# Patient Record
Sex: Male | Born: 1960 | Hispanic: Yes | Marital: Single | Smoking: Never smoker
Health system: Southern US, Community
[De-identification: ages and names within clinical notes are randomized; demographics above are authoritative.]

## PROBLEM LIST (undated history)

## (undated) DIAGNOSIS — M1990 Unspecified osteoarthritis, unspecified site: Secondary | ICD-10-CM

## (undated) DIAGNOSIS — G939 Disorder of brain, unspecified: Secondary | ICD-10-CM

## (undated) DIAGNOSIS — S069X9S Unspecified intracranial injury with loss of consciousness of unspecified duration, sequela: Secondary | ICD-10-CM

## (undated) DIAGNOSIS — F419 Anxiety disorder, unspecified: Secondary | ICD-10-CM

## (undated) DIAGNOSIS — F32A Depression, unspecified: Secondary | ICD-10-CM

## (undated) DIAGNOSIS — K5792 Diverticulitis of intestine, part unspecified, without perforation or abscess without bleeding: Secondary | ICD-10-CM

## (undated) DIAGNOSIS — F329 Major depressive disorder, single episode, unspecified: Secondary | ICD-10-CM

## (undated) HISTORY — DX: Unspecified intracranial injury with loss of consciousness of unspecified duration, sequela: S06.9X9S

## (undated) HISTORY — DX: Major depressive disorder, single episode, unspecified: F32.9

## (undated) HISTORY — DX: Disorder of brain, unspecified: G93.9

## (undated) HISTORY — DX: Depression, unspecified: F32.A

## (undated) HISTORY — DX: Anxiety disorder, unspecified: F41.9

## (undated) HISTORY — PX: NO PAST SURGERIES: SHX2092

## (undated) HISTORY — DX: Diverticulitis of intestine, part unspecified, without perforation or abscess without bleeding: K57.92

## (undated) HISTORY — DX: Unspecified osteoarthritis, unspecified site: M19.90

---

## 2016-09-02 ENCOUNTER — Emergency Department
Admission: EM | Admit: 2016-09-02 | Discharge: 2016-09-02 | Disposition: A | Discharge status: Home / Self Care | Payer: Medicare HMO | Attending: Emergency Medicine | Admitting: Emergency Medicine

## 2016-09-02 ENCOUNTER — Emergency Department: Payer: Medicare HMO

## 2016-09-02 DIAGNOSIS — J989 Respiratory disorder, unspecified: Secondary | ICD-10-CM | POA: Insufficient documentation

## 2016-09-02 DIAGNOSIS — J40 Bronchitis, not specified as acute or chronic: Secondary | ICD-10-CM

## 2016-09-02 DIAGNOSIS — J988 Other specified respiratory disorders: Secondary | ICD-10-CM

## 2016-09-02 DIAGNOSIS — R05 Cough: Secondary | ICD-10-CM | POA: Diagnosis present

## 2016-09-02 DIAGNOSIS — B9789 Other viral agents as the cause of diseases classified elsewhere: Secondary | ICD-10-CM

## 2016-09-02 LAB — POCT RAPID STREP A: STREPTOCOCCUS, GROUP A SCREEN (DIRECT): NEGATIVE

## 2016-09-02 MED ORDER — CETIRIZINE HCL 10 MG PO TABS: 10.0000 mg | ORAL_TABLET | Freq: Every day | ORAL | 0 refills | Status: AC

## 2016-09-02 MED ORDER — PREDNISONE 50 MG PO TABS: 50.0000 mg | ORAL_TABLET | Freq: Every day | ORAL | 0 refills | Status: DC

## 2016-09-02 MED ORDER — ALBUTEROL SULFATE HFA 108 (90 BASE) MCG/ACT IN AERS: 2.0000 | INHALATION_SPRAY | RESPIRATORY_TRACT | 0 refills | Status: AC | PRN

## 2016-09-02 MED ORDER — CYCLOBENZAPRINE HCL 10 MG PO TABS: 10.0000 mg | ORAL_TABLET | Freq: Three times a day (TID) | ORAL | 0 refills | Status: DC | PRN

## 2016-09-02 MED ORDER — PSEUDOEPH-BROMPHEN-DM 30-2-10 MG/5ML PO SYRP: 10.0000 mL | ORAL_SOLUTION | Freq: Four times a day (QID) | ORAL | 0 refills | Status: DC | PRN

## 2016-09-02 MED ORDER — FLUTICASONE PROPIONATE 50 MCG/ACT NA SUSP: 1.0000 | Freq: Two times a day (BID) | NASAL | 0 refills | Status: DC

## 2016-09-02 NOTE — ED Triage Notes (Signed)
Pt in with co cough and congestion since yesterday. Pt states chest soreness only when he coughs or takes a deep breath. Pt also sore throat and body aches.

## 2016-09-02 NOTE — ED Provider Notes (Signed)
[facility] Emergency Department Provider Note  ____________________________________________  Time seen: Approximately 8:31 PM  I have reviewed the triage vital signs and the nursing notes.   HISTORY  Chief Complaint Cough    HPI Eric Lloyd is a 55 y.o. male who presents emergency department complaining ofnasal congestion, sore throat, cough, fevers and chills, myalgias. Patient states that symptoms have been ongoing 2 days. He has not tried any medications over-the-counter. Patient reports that he does have a burning sensation in his chest when he coughs. Patient denies any headache, visual changes, neck pain, ongoing chest pain, shortness of breath, abdominal pain, nausea or vomiting. Patient does have sick contacts within the family   No past medical history on file.  There are no active problems to display for this patient.   No past surgical history on file.  Prior to Admission medications   Medication Sig Start Date End Date Taking? Authorizing Provider  albuterol (PROVENTIL HFA;VENTOLIN HFA) 108 (90 Base) MCG/ACT inhaler Inhale 2 puffs into the lungs every 4 (four) hours as needed for wheezing or shortness of breath. 09/02/16   Charline Bills Cuthriell, PA-C  brompheniramine-pseudoephedrine-DM 30-2-10 MG/5ML syrup Take 10 mLs by mouth 4 (four) times daily as needed. 09/02/16   Charline Bills Cuthriell, PA-C  cetirizine (ZYRTEC) 10 MG tablet Take 1 tablet (10 mg total) by mouth daily. 09/02/16   Charline Bills Cuthriell, PA-C  cyclobenzaprine (FLEXERIL) 10 MG tablet Take 1 tablet (10 mg total) by mouth 3 (three) times daily as needed for muscle spasms. 09/02/16   Charline Bills Cuthriell, PA-C  fluticasone (FLONASE) 50 MCG/ACT nasal spray Place 1 spray into both nostrils 2 (two) times daily. 09/02/16   Charline Bills Cuthriell, PA-C  predniSONE (DELTASONE) 50 MG tablet Take 1 tablet (50 mg total) by mouth daily with breakfast. 09/02/16   Charline Bills Cuthriell, PA-C     Allergies Patient has no known allergies.  No family history on file.  Social History Social History  Substance Use Topics  . Smoking status: Not on file  . Smokeless tobacco: Not on file  . Alcohol use Not on file     Review of Systems  Constitutional: No fever/chills Eyes: No visual changes. ENT: Positive for nasal congestion and sore throat Cardiovascular: no chest pain. Respiratory: Positive cough. No SOB. Gastrointestinal: No abdominal pain.  No nausea, no vomiting.  No diarrhea.  No constipation. Musculoskeletal: Negative for musculoskeletal pain. Skin: Negative for rash, abrasions, lacerations, ecchymosis. Neurological: Negative for headaches, focal weakness or numbness. 10-point ROS otherwise negative.  ____________________________________________   PHYSICAL EXAM:  VITAL SIGNS: ED Triage Vitals  Enc Vitals Group     BP 09/02/16 1925 113/86     Pulse Rate 09/02/16 1925 90     Resp 09/02/16 1925 20     Temp 09/02/16 1925 98.4 F (36.9 C)     Temp Source 09/02/16 1925 Oral     SpO2 09/02/16 1925 96 %     Weight 09/02/16 1925 185 lb (83.9 kg)     Height 09/02/16 1925 5\' 11"  (1.803 m)     Head Circumference --      Peak Flow --      Pain Score 09/02/16 2016 7     Pain Loc --      Pain Edu? --      Excl. in [place]? --      Constitutional: Alert and oriented. Well appearing and in no acute distress. Eyes: Conjunctivae are normal. PERRL. EOMI. Head:  Atraumatic. ENT:      Ears: EACs unremarkable bilaterally. TMs unremarkable bilaterally.      Nose: Moderate purulent congestion/rhinnorhea.      Mouth/Throat: Mucous membranes are moist. Oropharynx is moderately erythematous but not edematous. Uvula is midline. Tonsils are erythematous but not edematous and no exudates present. Neck: No stridor. Neck is supple with full range of motion Hematological/Lymphatic/Immunilogical: No cervical lymphadenopathy. Cardiovascular: Normal rate, regular rhythm. Normal S1  and S2.  Good peripheral circulation. Respiratory: Normal respiratory effort without tachypnea or retractions. Lungs with a few common minor expiratory scattered wheezes. Good air entry to the bases with no decreased or absent breath sounds. *Gastrointestinal: Bowel sounds 4 quadrants. Soft and nontender to palpation. No guarding or rigidity. No palpable masses. No distention.  Musculoskeletal: Full range of motion to all extremities. No gross deformities appreciated. Neurologic:  Normal speech and language. No gross focal neurologic deficits are appreciated.  Skin:  Skin is warm, dry and intact. No rash noted. Psychiatric: Mood and affect are normal. Speech and behavior are normal. Patient exhibits appropriate insight and judgement.   ____________________________________________   LABS (all labs ordered are listed, but only abnormal results are displayed)  Labs Reviewed  POCT RAPID STREP A   ____________________________________________  EKG  EKG reveals normal sinus rhythm at a rate of 82 bpm. No ST elevation or depression noted. Nonspecific T-wave changes in V2 through V4. PR, QRS, QT intervals within normal limits. No Q waves or delta waves identified. No STEMI. Normal EKG. ____________________________________________  RADIOLOGY Diamantina Providence Cuthriell, personally viewed and evaluated these images (plain radiographs) as part of my medical decision making, as well as reviewing the written report by the radiologist.  Dg Chest 2 View  Result Date: 09/02/2016 CLINICAL DATA:  55 year old male with a history of cough and congestion EXAM: CHEST  2 VIEW COMPARISON:  None. FINDINGS: The heart size and mediastinal contours are within normal limits. Both lungs are clear. The visualized skeletal structures are unremarkable. IMPRESSION: No radiographic evidence of acute cardiopulmonary disease. Signed, Dulcy Fanny. Earleen Newport, DO [facility]  Electronically Signed   By: Corrie Mckusick D.O.   On: 09/02/2016 19:52    ____________________________________________    PROCEDURES  Procedure(s) performed:    Procedures    Medications - No data to display   ____________________________________________   INITIAL IMPRESSION / ASSESSMENT AND PLAN / ED COURSE  Pertinent labs & imaging results that were available during my care of the patient were reviewed by me and considered in my medical decision making (see chart for details).  Review of the [place] CSRS was performed in accordance of the [place] prior to dispensing any controlled drugs.  Clinical Course     Patient's diagnosis is consistent with Probable upper respiratory illness with bronchitis. Patient's exam is reassuring. X-ray reveals no acute consolidation consistent with pneumonia and no other cardiopulmonary abnormality Patient will be discharged home with prescriptions for symptom control medications. Patient is to follow up with primary care as needed or otherwise directed. Patient is given ED precautions to return to the ED for any worsening or new symptoms.     ____________________________________________  FINAL CLINICAL IMPRESSION(S) / ED DIAGNOSES  Final diagnoses:  Viral respiratory illness  Bronchitis      NEW MEDICATIONS STARTED DURING THIS VISIT:  Discharge Medication List as of 09/02/2016  8:47 PM    START taking these medications   Details  albuterol (PROVENTIL HFA;VENTOLIN HFA) 108 (90 Base) MCG/ACT inhaler  Inhale 2 puffs into the lungs every 4 (four) hours as needed for wheezing or shortness of breath., Starting Tue 09/02/2016, Print    brompheniramine-pseudoephedrine-DM 30-2-10 MG/5ML syrup Take 10 mLs by mouth 4 (four) times daily as needed., Starting Tue 09/02/2016, Print    cetirizine (ZYRTEC) 10 MG tablet Take 1 tablet (10 mg total) by mouth daily., Starting Tue 09/02/2016, Print    cyclobenzaprine (FLEXERIL) 10 MG tablet Take 1 tablet  (10 mg total) by mouth 3 (three) times daily as needed for muscle spasms., Starting Tue 09/02/2016, Print    fluticasone (FLONASE) 50 MCG/ACT nasal spray Place 1 spray into both nostrils 2 (two) times daily., Starting Tue 09/02/2016, Print    predniSONE (DELTASONE) 50 MG tablet Take 1 tablet (50 mg total) by mouth daily with breakfast., Starting Tue 09/02/2016, Print            This chart was dictated using voice recognition software/Dragon. Despite best efforts to proofread, errors can occur which can change the meaning. Any change was purely unintentional.    Darletta Moll, PA-C 09/03/16 0008    Nance Pear, MD 09/06/16 1501

## 2016-09-25 ENCOUNTER — Ambulatory Visit: Payer: Medicaid Other | Admitting: Family Medicine

## 2016-09-27 ENCOUNTER — Encounter: Payer: Self-pay | Admitting: Urgent Care

## 2016-09-27 DIAGNOSIS — R51 Headache: Secondary | ICD-10-CM | POA: Insufficient documentation

## 2016-09-27 DIAGNOSIS — R109 Unspecified abdominal pain: Secondary | ICD-10-CM | POA: Diagnosis present

## 2016-09-27 DIAGNOSIS — Z79899 Other long term (current) drug therapy: Secondary | ICD-10-CM | POA: Insufficient documentation

## 2016-09-27 DIAGNOSIS — R1084 Generalized abdominal pain: Secondary | ICD-10-CM | POA: Insufficient documentation

## 2016-09-27 DIAGNOSIS — R1033 Periumbilical pain: Secondary | ICD-10-CM | POA: Diagnosis not present

## 2016-09-27 NOTE — ED Triage Notes (Signed)
Patient with complaint of generalized abdominal pain times 15- 20 minutes. Patient denies nausea, vomiting or diarrhea. Patient reports that he has had similar symptoms in the past but has never been seen for them.

## 2016-09-27 NOTE — ED Triage Notes (Signed)
Patient presents with c/o periumbilical pain that started approximately 15 minutes PTA. Patient reports that this pain is recurrent; has been intermittent for over a year. (+) constipation reported. Patient also has a headache.

## 2016-09-28 ENCOUNTER — Emergency Department: Payer: Medicare HMO

## 2016-09-28 ENCOUNTER — Emergency Department
Admission: EM | Admit: 2016-09-28 | Discharge: 2016-09-28 | Disposition: A | Discharge status: Home / Self Care | Payer: Medicare HMO | Attending: Emergency Medicine | Admitting: Emergency Medicine

## 2016-09-28 DIAGNOSIS — R1033 Periumbilical pain: Secondary | ICD-10-CM | POA: Diagnosis not present

## 2016-09-28 DIAGNOSIS — R1084 Generalized abdominal pain: Secondary | ICD-10-CM | POA: Diagnosis not present

## 2016-09-28 LAB — CBC
HCT: 47.6 % (ref 40.0–52.0)
Hemoglobin: 16.3 g/dL (ref 13.0–18.0)
MCH: 29.5 pg (ref 26.0–34.0)
MCHC: 34.3 g/dL (ref 32.0–36.0)
MCV: 86.1 fL (ref 80.0–100.0)
PLATELETS: 264 10*3/uL (ref 150–440)
RBC: 5.53 MIL/uL (ref 4.40–5.90)
RDW: 13.9 % (ref 11.5–14.5)
WBC: 9.4 10*3/uL (ref 3.8–10.6)

## 2016-09-28 LAB — COMPREHENSIVE METABOLIC PANEL
ALK PHOS: 49 U/L (ref 38–126)
ALT: 21 U/L (ref 17–63)
AST: 23 U/L (ref 15–41)
Albumin: 4.2 g/dL (ref 3.5–5.0)
Anion gap: 7 (ref 5–15)
BUN: 14 mg/dL (ref 6–20)
CALCIUM: 9.5 mg/dL (ref 8.9–10.3)
CO2: 26 mmol/L (ref 22–32)
CREATININE: 1.16 mg/dL (ref 0.61–1.24)
Chloride: 104 mmol/L (ref 101–111)
GFR calc non Af Amer: >60 mL/min (ref >60)
Glucose, Bld: 98 mg/dL (ref 65–99)
Potassium: 4.2 mmol/L (ref 3.5–5.1)
SODIUM: 137 mmol/L (ref 135–145)
Total Bilirubin: 0.8 mg/dL (ref 0.3–1.2)
Total Protein: 7.7 g/dL (ref 6.5–8.1)

## 2016-09-28 LAB — URINALYSIS, COMPLETE (UACMP) WITH MICROSCOPIC
Bacteria, UA: NONE SEEN
Bilirubin Urine: NEGATIVE
GLUCOSE, UA: NEGATIVE mg/dL
Ketones, ur: NEGATIVE mg/dL
Leukocytes, UA: NEGATIVE
Nitrite: NEGATIVE
PH: 7 (ref 5.0–8.0)
Protein, ur: NEGATIVE mg/dL
SPECIFIC GRAVITY, URINE: 1.012 (ref 1.005–1.030)

## 2016-09-28 LAB — LIPASE, BLOOD: Lipase: 24 U/L (ref 11–51)

## 2016-09-28 MED ORDER — BUTALBITAL-APAP-CAFFEINE 50-325-40 MG PO TABS
1.0000 | ORAL_TABLET | Freq: Once | ORAL | Status: AC
  Administered 2016-09-28: 1 via ORAL

## 2016-09-28 MED ORDER — POLYETHYLENE GLYCOL 3350 17 G PO PACK: 17.0000 g | PACK | Freq: Every day | ORAL | 0 refills | Status: AC

## 2016-09-28 MED ORDER — GI COCKTAIL ~~LOC~~
30.0000 mL | Freq: Once | ORAL | Status: AC
  Administered 2016-09-28: 30 mL via ORAL
  Filled 2016-09-28: qty 30

## 2016-09-28 MED ORDER — BUTALBITAL-APAP-CAFFEINE 50-325-40 MG PO TABS
ORAL_TABLET | ORAL | Status: AC
  Filled 2016-09-28: qty 1

## 2016-09-28 MED ORDER — DOCUSATE SODIUM 100 MG PO CAPS: 100.0000 mg | ORAL_CAPSULE | Freq: Every day | ORAL | 0 refills | Status: AC

## 2016-09-28 MED ORDER — SUCRALFATE 1 G PO TABS: 1.0000 g | ORAL_TABLET | Freq: Two times a day (BID) | ORAL | 0 refills | Status: DC

## 2016-09-28 NOTE — ED Notes (Signed)
In at [facility] request to collect blood and urine specimen

## 2016-09-28 NOTE — ED Provider Notes (Signed)
[facility] Emergency Department Provider Note   ____________________________________________   First MD Initiated Contact with Patient 09/28/16 [phone]     (approximate)  I have reviewed the triage vital signs and the nursing notes.   HISTORY  Chief Complaint Abdominal Pain    HPI Eric Lloyd is a 56 y.o. male who comes into the hospital today with abdominal pain. According to his significant other he's had this pain for a long time but today was stronger than before. He reports that it still there and rates the pain a 9 out of 10 in intensity. The patient reports that the pain is in his mid abdomen across his abdomen. He did not take anything for pain. He also has been having some daily headaches and has taken Tylenol but it has not helped. He reports that this is also been going on for more than a year. He just got insurance and has a primary care physician appointment on Wednesday. According to the patient's significant other he had had a traumatic brain injury and had been on medications for headaches in the past. The patient has not had a nausea or vomiting but has had a problem with constipation. She reports that he is also had diverticulitis and was on medication for that as well but not anymore. The patient's wife reports that he had a small amount of a bowel movement today and whenever he uses the bathroom in small balls. The patient has not had any fevers or any problems urinating. He does not take anything for his constipation. He has no chest pain or shortness of breath. He was taking Abilify and Depakote previously he states for headache and psych issues but he finished them in December and has not had any refills. The patient is here today for evaluation.   History reviewed. No pertinent past medical history.  There are no active problems to display for this patient.   History reviewed. No pertinent surgical history.  Prior to Admission medications    Medication Sig Start Date End Date Taking? Authorizing Provider  albuterol (PROVENTIL HFA;VENTOLIN HFA) 108 (90 Base) MCG/ACT inhaler Inhale 2 puffs into the lungs every 4 (four) hours as needed for wheezing or shortness of breath. 09/02/16   Charline Bills Cuthriell, PA-C  brompheniramine-pseudoephedrine-DM 30-2-10 MG/5ML syrup Take 10 mLs by mouth 4 (four) times daily as needed. 09/02/16   Charline Bills Cuthriell, PA-C  cetirizine (ZYRTEC) 10 MG tablet Take 1 tablet (10 mg total) by mouth daily. 09/02/16   Charline Bills Cuthriell, PA-C  cyclobenzaprine (FLEXERIL) 10 MG tablet Take 1 tablet (10 mg total) by mouth 3 (three) times daily as needed for muscle spasms. 09/02/16   Charline Bills Cuthriell, PA-C  docusate sodium (COLACE) 100 MG capsule Take 1 capsule (100 mg total) by mouth daily. 09/28/16 09/28/17  Loney Hering, MD  fluticasone (FLONASE) 50 MCG/ACT nasal spray Place 1 spray into both nostrils 2 (two) times daily. 09/02/16   Charline Bills Cuthriell, PA-C  polyethylene glycol (MIRALAX) packet Take 17 g by mouth daily. 09/28/16   Loney Hering, MD  predniSONE (DELTASONE) 50 MG tablet Take 1 tablet (50 mg total) by mouth daily with breakfast. 09/02/16   Charline Bills Cuthriell, PA-C  sucralfate (CARAFATE) 1 g tablet Take 1 tablet (1 g total) by mouth 2 (two) times daily. 09/28/16   Loney Hering, MD    Allergies Patient has no known allergies.  No family history on file.  Social History Social History  Substance Use Topics  . Smoking status: Never Smoker  . Smokeless tobacco: Never Used  . Alcohol use No    Review of Systems Constitutional: No fever/chills Eyes: No visual changes. ENT: No sore throat. Cardiovascular: Denies chest pain. Respiratory: Denies shortness of breath. Gastrointestinal: abdominal pain and constipation with No nausea, no vomiting, No diarrhea.   Genitourinary: Negative for dysuria. Musculoskeletal: Negative for back pain. Skin: Negative for rash. Neurological:  Negative for headaches, focal weakness or numbness.  10-point ROS otherwise negative.  ____________________________________________   PHYSICAL EXAM:  VITAL SIGNS: ED Triage Vitals [09/27/16 2213]  Enc Vitals Group     BP 129/87     Pulse Rate 67     Resp 16     Temp 97.8 F (36.6 C)     Temp Source Oral     SpO2 98 %     Weight 185 lb (83.9 kg)     Height 5\' 11"  (1.803 m)     Head Circumference      Peak Flow      Pain Score 10     Pain Loc      Pain Edu?      Excl. in [place]?    Constitutional: Alert and oriented. Well appearing and in mild distress. Eyes: Conjunctivae are normal. PERRL. EOMI. Head: Atraumatic. Nose: No congestion/rhinnorhea. Mouth/Throat: Mucous membranes are moist.  Oropharynx non-erythematous. Cardiovascular: Normal rate, regular rhythm. Grossly normal heart sounds.  Good peripheral circulation. Respiratory: Normal respiratory effort.  No retractions. Lungs CTAB. Gastrointestinal: Soft with no tenderness to palpation of the abdomen. No distention. Positive bowel sounds Musculoskeletal: No lower extremity tenderness nor edema.   Neurologic:  Normal speech and language.  Skin:  Skin is warm, dry and intact.  Psychiatric: Mood and affect are normal.   ____________________________________________   LABS (all labs ordered are listed, but only abnormal results are displayed)  Labs Reviewed  URINALYSIS, COMPLETE (UACMP) WITH MICROSCOPIC - Abnormal; Notable for the following:       Result Value   Color, Urine YELLOW (*)    APPearance CLEAR (*)    Hgb urine dipstick SMALL (*)    Squamous Epithelial / LPF 0-5 (*)    All other components within normal limits  LIPASE, BLOOD  COMPREHENSIVE METABOLIC PANEL  CBC   ____________________________________________  EKG  none ____________________________________________  RADIOLOGY  KUB ____________________________________________   PROCEDURES  Procedure(s) performed: None  Procedures  Critical  Care performed: No  ____________________________________________   INITIAL IMPRESSION / ASSESSMENT AND PLAN / ED COURSE  Pertinent labs & imaging results that were available during my care of the patient were reviewed by me and considered in my medical decision making (see chart for details).  This is a 56 year old male who comes into the hospital today with abdominal pain. He has had this pain on and off for over a year but reports that it was really bad today. The patient is not tender on exam and his blood work is unremarkable. I'll give the patient a GI cocktail and sent him for a KUB. The patient does have a history of constipation so it is likely that his pain may be due to his constipation. I will then reassess the patient.  Clinical Course as of Sep 29 343  Nancy Fetter Sep 28, 2016  0242 Nonobstructive bowel gas pattern. DG Abdomen 1 View [AW]    Clinical Course User Index [AW] Loney Hering, MD   After the GI cocktail the patient reports this pain  is mildly improved. The patient's significant other did ask me about linzess. I then asked if he has had abdominal problems in the past. She reports that he has. She was not familiar with IBS but if he was on linzess it is likely the patient has some IBS. I informed her that he needs to follow-up with his primary care physician for further evaluation and for this medication to be restarted. The patient will be discharged to home.  ____________________________________________   FINAL CLINICAL IMPRESSION(S) / ED DIAGNOSES  Final diagnoses:  Generalized abdominal pain      NEW MEDICATIONS STARTED DURING THIS VISIT:  New Prescriptions   DOCUSATE SODIUM (COLACE) 100 MG CAPSULE    Take 1 capsule (100 mg total) by mouth daily.   POLYETHYLENE GLYCOL (MIRALAX) PACKET    Take 17 g by mouth daily.   SUCRALFATE (CARAFATE) 1 G TABLET    Take 1 tablet (1 g total) by mouth 2 (two) times daily.     Note:  This document was prepared using  Dragon voice recognition software and may include unintentional dictation errors.    Loney Hering, MD 09/28/16 [phone]

## 2016-10-01 ENCOUNTER — Ambulatory Visit: Payer: Medicaid Other | Admitting: Family Medicine

## 2016-10-13 ENCOUNTER — Other Ambulatory Visit: Payer: Self-pay

## 2016-10-16 ENCOUNTER — Encounter: Payer: Self-pay | Admitting: Family Medicine

## 2016-10-16 ENCOUNTER — Ambulatory Visit (INDEPENDENT_AMBULATORY_CARE_PROVIDER_SITE_OTHER): Payer: Medicare HMO | Admitting: Family Medicine

## 2016-10-16 VITALS — BP 120/72 | HR 79 | Temp 98.4°F | Ht 73.0 in | Wt 184.0 lb

## 2016-10-16 DIAGNOSIS — F322 Major depressive disorder, single episode, severe without psychotic features: Secondary | ICD-10-CM | POA: Diagnosis not present

## 2016-10-16 DIAGNOSIS — F419 Anxiety disorder, unspecified: Secondary | ICD-10-CM | POA: Insufficient documentation

## 2016-10-16 DIAGNOSIS — Z113 Encounter for screening for infections with a predominantly sexual mode of transmission: Secondary | ICD-10-CM

## 2016-10-16 DIAGNOSIS — F329 Major depressive disorder, single episode, unspecified: Secondary | ICD-10-CM | POA: Insufficient documentation

## 2016-10-16 DIAGNOSIS — F332 Major depressive disorder, recurrent severe without psychotic features: Secondary | ICD-10-CM | POA: Diagnosis not present

## 2016-10-16 DIAGNOSIS — G8929 Other chronic pain: Secondary | ICD-10-CM | POA: Diagnosis not present

## 2016-10-16 DIAGNOSIS — Z13220 Encounter for screening for lipoid disorders: Secondary | ICD-10-CM | POA: Diagnosis not present

## 2016-10-16 DIAGNOSIS — Z125 Encounter for screening for malignant neoplasm of prostate: Secondary | ICD-10-CM | POA: Diagnosis not present

## 2016-10-16 DIAGNOSIS — F32A Depression, unspecified: Secondary | ICD-10-CM | POA: Insufficient documentation

## 2016-10-16 DIAGNOSIS — R1084 Generalized abdominal pain: Secondary | ICD-10-CM | POA: Diagnosis not present

## 2016-10-16 LAB — UA/M W/RFLX CULTURE, ROUTINE
BILIRUBIN UA: NEGATIVE
GLUCOSE, UA: NEGATIVE
KETONES UA: NEGATIVE
LEUKOCYTES UA: NEGATIVE
Nitrite, UA: NEGATIVE
PROTEIN UA: NEGATIVE
SPEC GRAV UA: 1.025 (ref 1.005–1.030)
Urobilinogen, Ur: 0.2 mg/dL (ref 0.2–1.0)
pH, UA: 5.5 (ref 5.0–7.5)

## 2016-10-16 LAB — MICROSCOPIC EXAMINATION

## 2016-10-16 MED ORDER — ARIPIPRAZOLE 10 MG PO TABS: 10.0000 mg | ORAL_TABLET | Freq: Every day | ORAL | 3 refills | Status: AC

## 2016-10-16 MED ORDER — CYCLOBENZAPRINE HCL 10 MG PO TABS: 10.0000 mg | ORAL_TABLET | Freq: Every day | ORAL | 0 refills | Status: DC

## 2016-10-16 MED ORDER — CYCLOBENZAPRINE HCL 10 MG PO TABS
10.0000 mg | ORAL_TABLET | Freq: Every day | ORAL | 0 refills | Status: AC
Start: 2016-10-16 — End: ?

## 2016-10-16 MED ORDER — DIVALPROEX SODIUM 500 MG PO DR TAB: 500.0000 mg | DELAYED_RELEASE_TABLET | Freq: Two times a day (BID) | ORAL | 3 refills | Status: AC

## 2016-10-16 NOTE — Progress Notes (Signed)
BP 120/72 (BP Location: Left Arm, Patient Position: Sitting, Cuff Size: Normal)   Pulse 79   Temp 98.4 F (36.9 C)   Ht 6\' 1"  (1.854 m)   Wt 184 lb (83.5 kg)   SpO2 97%   BMI 24.28 kg/m    Subjective:    Patient ID: Eric Lloyd, male    DOB: 03-29-1961, 56 y.o.   MRN: [id number]  HPI: Eric Lloyd is a 56 y.o. male  Chief Complaint  Patient presents with  . Establish Care   ABDOMINAL PAIN- [place] in [place] with diverticulitis 2 years ago, has been having problems with his stomach for about 4 months.  Duration: about 4 months Onset: gradual Severity: 6/10 Quality: pressure Location:  diffuse  Episode duration: comes and goes 30 minutes Radiation: no Frequency: intermittent Alleviating factors: nexium- just a little, linzess Aggravating factors: kneeling Status: better Treatments attempted: linzess and PPI, tums Fever: no Nausea: no Vomiting: no Weight loss: yes- 15-20lbs, over 2 years Decreased appetite: yes Diarrhea: no Constipation: yes Blood in stool: no Heartburn: yes Jaundice: no Rash: no Dysuria/urinary frequency: no Hematuria: no Recurrent NSAID use: no  Colonoscopy- about 2 years ago, found polyps and diverticulosis  DEPRESSION- has been treated by a psychiatrist since 1991- has been following with them, feels like the medicine is working. Has been off his medicine for about 4 months. Has been having issues with his family  Mood status: uncontrolled Satisfied with current treatment?: yes Symptom severity: severe  Duration of current treatment : chronic Side effects: no Medication compliance: poor compliance Psychotherapy/counseling: no  Previous psychiatric medications: depakote and abilify Depressed mood: yes Anxious mood: yes Anhedonia: yes Significant weight loss or gain: yes Insomnia: yes- hard to fall asleep and stay asleep  Fatigue: no Feelings of worthlessness or guilt: yes Impaired concentration/indecisiveness: yes Suicidal ideations:  no Hopelessness: yes Crying spells: yes Depression screen PHQ 2/9 10/16/2016  Decreased Interest 1  Down, Depressed, Hopeless 3  PHQ - 2 Score 4  Altered sleeping 3  Tired, decreased energy 0  Change in appetite 3  Feeling bad or failure about yourself  3  Trouble concentrating 3  Moving slowly or fidgety/restless 3  Suicidal thoughts 1  PHQ-9 Score 20   GAD 7 : Generalized Anxiety Score 10/16/2016  Nervous, Anxious, on Edge 3  Control/stop worrying 3  Worry too much - different things 3  Trouble relaxing 3  Restless 3  Easily annoyed or irritable 0  Afraid - awful might happen 3  Total GAD 7 Score 18    Active Ambulatory Problems    Diagnosis Date Noted  . Anxiety   . Depression   . Chronic generalized abdominal pain 10/16/2016   Resolved Ambulatory Problems    Diagnosis Date Noted  . No Resolved Ambulatory Problems   Past Medical History:  Diagnosis Date  . Anxiety   . Arthritis   . 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 Depression   . Diverticulitis    History reviewed. No pertinent surgical history. Outpatient Encounter Prescriptions as of 10/16/2016  Medication Sig  . albuterol (PROVENTIL HFA;VENTOLIN HFA) 108 (90 Base) MCG/ACT inhaler Inhale 2 puffs into the lungs every 4 (four) hours as needed for wheezing or shortness of breath.  . ARIPiprazole (ABILIFY) 10 MG tablet Take 1 tablet (10 mg total) by mouth daily.  . cetirizine (ZYRTEC) 10 MG tablet Take 1 tablet (10 mg total) by mouth daily.  . cyclobenzaprine (FLEXERIL) 10 MG tablet Take 1 tablet (10  mg total) by mouth at bedtime.  . divalproex (DEPAKOTE) 500 MG DR tablet Take 1 tablet (500 mg total) by mouth 2 (two) times daily.  Marland Kitchen docusate sodium (COLACE) 100 MG capsule Take 1 capsule (100 mg total) by mouth daily.  . fluticasone (FLONASE) 50 MCG/ACT nasal spray Place 1 spray into both nostrils 2 (two) times daily.  . polyethylene glycol (MIRALAX) packet Take 17 g by mouth daily.  . [DISCONTINUED] ARIPiprazole  (ABILIFY) 10 MG tablet Take 10 mg by mouth daily.  . [DISCONTINUED] cyclobenzaprine (FLEXERIL) 10 MG tablet Take 1 tablet (10 mg total) by mouth 3 (three) times daily as needed for muscle spasms.  . [DISCONTINUED] cyclobenzaprine (FLEXERIL) 10 MG tablet Take 1 tablet (10 mg total) by mouth at bedtime.  . [DISCONTINUED] divalproex (DEPAKOTE) 500 MG DR tablet Take 500 mg by mouth 3 (three) times daily.  . [DISCONTINUED] brompheniramine-pseudoephedrine-DM 30-2-10 MG/5ML syrup Take 10 mLs by mouth 4 (four) times daily as needed. (Patient not taking: Reported on 10/16/2016)  . [DISCONTINUED] sucralfate (CARAFATE) 1 g tablet Take 1 tablet (1 g total) by mouth 2 (two) times daily. (Patient not taking: Reported on 10/16/2016)   No facility-administered encounter medications on file as of 10/16/2016.    No Known Allergies Social History   Social History  . Marital status: Single    Spouse name: N/A  . Number of children: N/A  . Years of education: N/A   Occupational History  . Not on file.   Social History Main Topics  . Smoking status: Never Smoker  . Smokeless tobacco: Never Used  . Alcohol use No  . Drug use: No  . Sexual activity: Not on file   Other Topics Concern  . Not on file   Social History Narrative  . No narrative on file   Family History  Problem Relation Age of Onset  . Diabetes Mother   . Hypertension Mother   . Cancer Mother   . Diabetes Brother     Review of Systems  Constitutional: Negative.   Respiratory: Negative.   Cardiovascular: Negative.   Gastrointestinal: Positive for abdominal pain and constipation. Negative for abdominal distention, anal bleeding, blood in stool, diarrhea, nausea, rectal pain and vomiting.  Genitourinary: Negative.   Psychiatric/Behavioral: Negative.     Per HPI unless specifically indicated above     Objective:    BP 120/72 (BP Location: Left Arm, Patient Position: Sitting, Cuff Size: Normal)   Pulse 79   Temp 98.4 F (36.9 C)    Ht 6\' 1"  (1.854 m)   Wt 184 lb (83.5 kg)   SpO2 97%   BMI 24.28 kg/m   Wt Readings from Last 3 Encounters:  10/16/16 184 lb (83.5 kg)  09/27/16 185 lb (83.9 kg)  09/02/16 185 lb (83.9 kg)    Physical Exam  Constitutional: He is oriented to person, place, and time. He appears well-developed and well-nourished. No distress.  HENT:  Head: Normocephalic and atraumatic.  Right Ear: Hearing normal.  Left Ear: Hearing normal.  Nose: Nose normal.  Eyes: Conjunctivae and lids are normal. Right eye exhibits no discharge. Left eye exhibits no discharge. No scleral icterus.  Pulmonary/Chest: Effort normal. No respiratory distress.  Musculoskeletal: Normal range of motion.  Neurological: He is alert and oriented to person, place, and time.  Skin: Skin is intact. No rash noted.  Psychiatric: He has a normal mood and affect. His speech is normal and behavior is normal. Judgment and thought content normal. Cognition and memory  are normal.    Results for orders placed or performed during the hospital encounter of 09/28/16  Lipase, blood  Result Value Ref Range   Lipase 24 11 - 51 U/L  Comprehensive metabolic panel  Result Value Ref Range   Sodium 137 135 - 145 mmol/L   Potassium 4.2 3.5 - 5.1 mmol/L   Chloride 104 101 - 111 mmol/L   CO2 26 22 - 32 mmol/L   Glucose, Bld 98 65 - 99 mg/dL   BUN 14 6 - 20 mg/dL   Creatinine, Ser 1.16 0.61 - 1.24 mg/dL   Calcium 9.5 8.9 - 10.3 mg/dL   Total Protein 7.7 6.5 - 8.1 g/dL   Albumin 4.2 3.5 - 5.0 g/dL   AST 23 15 - 41 U/L   ALT 21 17 - 63 U/L   Alkaline Phosphatase 49 38 - 126 U/L   Total Bilirubin 0.8 0.3 - 1.2 mg/dL   GFR calc non Af Amer >60 >60 mL/min   GFR calc Af Amer >60 >60 mL/min   Anion gap 7 5 - 15  CBC  Result Value Ref Range   WBC 9.4 3.8 - 10.6 K/uL   RBC 5.53 4.40 - 5.90 MIL/uL   Hemoglobin 16.3 13.0 - 18.0 g/dL   HCT 47.6 40.0 - 52.0 %   MCV 86.1 80.0 - 100.0 fL   MCH 29.5 26.0 - 34.0 pg   MCHC 34.3 32.0 - 36.0 g/dL    RDW 13.9 11.5 - 14.5 %   Platelets 264 150 - 440 K/uL  Urinalysis, Complete w Microscopic  Result Value Ref Range   Color, Urine YELLOW (A) YELLOW   APPearance CLEAR (A) CLEAR   Specific Gravity, Urine 1.012 1.005 - 1.030   pH 7.0 5.0 - 8.0   Glucose, UA NEGATIVE NEGATIVE mg/dL   Hgb urine dipstick SMALL (A) NEGATIVE   Bilirubin Urine NEGATIVE NEGATIVE   Ketones, ur NEGATIVE NEGATIVE mg/dL   Protein, ur NEGATIVE NEGATIVE mg/dL   Nitrite NEGATIVE NEGATIVE   Leukocytes, UA NEGATIVE NEGATIVE   RBC / HPF 0-5 0 - 5 RBC/hpf   WBC, UA 0-5 0 - 5 WBC/hpf   Bacteria, UA NONE SEEN NONE SEEN   Squamous Epithelial / LPF 0-5 (A) NONE SEEN      Assessment & Plan:   Problem List Items Addressed This Visit      Other   Anxiety    Not under good control. Unclear of diagnosis. Will get him back started on his medicine and get him into psychiatry. Checking levels today.      Relevant Orders   CBC with Differential/Platelet   Comprehensive metabolic panel   TSH   Ambulatory referral to Psychiatry   Valproic Acid level   Depression    Not under good control. Unclear of diagnosis. Will get him back started on his medicine and get him into psychiatry. Checking levels today.      Chronic generalized abdominal pain    Of unclear etiology. Will get him into GI.      Relevant Orders   Ambulatory referral to Gastroenterology    Other Visit Diagnoses    Depression, major, single episode, severe ([place])    -  Primary   Relevant Orders   CBC with Differential/Platelet   Comprehensive metabolic panel   TSH   Ambulatory referral to Psychiatry   Valproic Acid level   Screening for cholesterol level       Checking labs today.   Relevant Orders  Lipid Panel w/o Chol/HDL Ratio   Routine screening for STI (sexually transmitted infection)       Labs drawn today   Relevant Orders   UA/M w/rflx Culture, Routine   Hepatitis, Acute   RPR   GC/Chlamydia Probe Amp   HSV(herpes simplex vrs) 1+2  ab-IgG   HIV antibody   Screening for prostate cancer       Labs drawn today.   Relevant Orders   PSA       Follow up plan: Return in about 4 weeks (around 11/13/2016) for Records Release Please.

## 2016-10-16 NOTE — Assessment & Plan Note (Signed)
Not under good control. Unclear of diagnosis. Will get him back started on his medicine and get him into psychiatry. Checking levels today.

## 2016-10-16 NOTE — Assessment & Plan Note (Signed)
Of unclear etiology. Will get him into GI.

## 2016-10-17 LAB — CBC WITH DIFFERENTIAL/PLATELET
BASOS ABS: 0 10*3/uL (ref 0.0–0.2)
Basos: 0 %
EOS (ABSOLUTE): 0.2 10*3/uL (ref 0.0–0.4)
EOS: 2 %
HEMOGLOBIN: 16.3 g/dL (ref 13.0–17.7)
Hematocrit: 48 % (ref 37.5–51.0)
IMMATURE GRANS (ABS): 0 10*3/uL (ref 0.0–0.1)
Immature Granulocytes: 0 %
LYMPHS: 26 %
Lymphocytes Absolute: 2.1 10*3/uL (ref 0.7–3.1)
MCH: 29.4 pg (ref 26.6–33.0)
MCHC: 34 g/dL (ref 31.5–35.7)
MCV: 87 fL (ref 79–97)
Monocytes Absolute: 0.6 10*3/uL (ref 0.1–0.9)
Monocytes: 7 %
NEUTROS ABS: 5.3 10*3/uL (ref 1.4–7.0)
Neutrophils: 65 %
Platelets: 254 10*3/uL (ref 150–379)
RBC: 5.55 x10E6/uL (ref 4.14–5.80)
RDW: 14 % (ref 12.3–15.4)
WBC: 8.2 10*3/uL (ref 3.4–10.8)

## 2016-10-17 LAB — COMPREHENSIVE METABOLIC PANEL
ALBUMIN: 4.2 g/dL (ref 3.5–5.5)
ALT: 21 IU/L (ref 0–44)
AST: 21 IU/L (ref 0–40)
Albumin/Globulin Ratio: 1.4 (ref 1.2–2.2)
Alkaline Phosphatase: 53 IU/L (ref 39–117)
BUN / CREAT RATIO: 12 (ref 9–20)
BUN: 15 mg/dL (ref 6–24)
Bilirubin Total: 0.5 mg/dL (ref 0.0–1.2)
CO2: 22 mmol/L (ref 18–29)
Calcium: 9.9 mg/dL (ref 8.7–10.2)
Chloride: 100 mmol/L (ref 96–106)
Creatinine, Ser: 1.22 mg/dL (ref 0.76–1.27)
GFR calc non Af Amer: 67 mL/min/{1.73_m2} (ref >59)
GFR, EST AFRICAN AMERICAN: 78 mL/min/{1.73_m2} (ref >59)
GLUCOSE: 88 mg/dL (ref 65–99)
Globulin, Total: 3 g/dL (ref 1.5–4.5)
Potassium: 4.7 mmol/L (ref 3.5–5.2)
Sodium: 139 mmol/L (ref 134–144)
TOTAL PROTEIN: 7.2 g/dL (ref 6.0–8.5)

## 2016-10-17 LAB — HEPATITIS PANEL, ACUTE
HEP A IGM: NEGATIVE
Hep B C IgM: NEGATIVE
Hep C Virus Ab: <0.1 s/co ratio (ref 0.0–0.9)
Hepatitis B Surface Ag: NEGATIVE

## 2016-10-17 LAB — LIPID PANEL W/O CHOL/HDL RATIO
Cholesterol, Total: 210 mg/dL — ABNORMAL HIGH (ref 100–199)
HDL: 57 mg/dL (ref >39)
LDL CALC: 83 mg/dL (ref 0–99)
Triglycerides: 348 mg/dL — ABNORMAL HIGH (ref 0–149)
VLDL CHOLESTEROL CAL: 70 mg/dL — AB (ref 5–40)

## 2016-10-17 LAB — HIV ANTIBODY (ROUTINE TESTING W REFLEX): HIV SCREEN 4TH GENERATION: NONREACTIVE

## 2016-10-17 LAB — TSH: TSH: 0.582 u[IU]/mL (ref 0.450–4.500)

## 2016-10-17 LAB — HSV(HERPES SIMPLEX VRS) I + II AB-IGG
HSV 1 Glycoprotein G Ab, IgG: 52.2 index — ABNORMAL HIGH (ref 0.00–0.90)
HSV 2 Glycoprotein G Ab, IgG: <0.91 index (ref 0.00–0.90)

## 2016-10-17 LAB — PSA: Prostate Specific Ag, Serum: 3.3 ng/mL (ref 0.0–4.0)

## 2016-10-17 LAB — VALPROIC ACID LEVEL: Valproic Acid Lvl: <4 ug/mL — ABNORMAL LOW (ref 50–100)

## 2016-10-17 LAB — RPR: RPR Ser Ql: NONREACTIVE

## 2016-10-18 LAB — GC/CHLAMYDIA PROBE AMP
Chlamydia trachomatis, NAA: NEGATIVE
NEISSERIA GONORRHOEAE BY PCR: NEGATIVE

## 2016-10-20 ENCOUNTER — Telehealth: Payer: Self-pay | Admitting: Family Medicine

## 2016-10-20 NOTE — Telephone Encounter (Signed)
Please let him know through the language line (he speaks Spanish) that all his labs came back normal except he's been exposed to the cold sore virus. Thanks!

## 2016-10-20 NOTE — Telephone Encounter (Signed)
Patient notified

## 2016-10-30 ENCOUNTER — Telehealth: Payer: Self-pay

## 2016-10-30 NOTE — Telephone Encounter (Signed)
Patient notified

## 2016-10-30 NOTE — Telephone Encounter (Signed)
Eric Lloyd- she's coming in today for an appointment at 56, I'll make sure she talks to you

## 2016-10-30 NOTE — Telephone Encounter (Signed)
Spoke with Receptionist at [facility] for patient's counseling appointment.  Patient was late to an appointment but they had him fill out the paperwork.  Explained that patient was given a card, and all his has to do is come in any day and be seen. [place] was notified of this. Tried calling patient to notify him of this. No answer. Will try again.   Tried calling his wife Myrta, number as been disconnected and is no longer in service.

## 2016-11-06 ENCOUNTER — Ambulatory Visit: Payer: Medicare HMO | Admitting: Family Medicine

## 2016-11-07 ENCOUNTER — Ambulatory Visit (INDEPENDENT_AMBULATORY_CARE_PROVIDER_SITE_OTHER): Payer: Medicare HMO | Admitting: Family Medicine

## 2016-11-07 ENCOUNTER — Encounter: Payer: Self-pay | Admitting: Family Medicine

## 2016-11-07 VITALS — BP 126/86 | HR 64 | Temp 97.9°F | Wt 191.0 lb

## 2016-11-07 DIAGNOSIS — F332 Major depressive disorder, recurrent severe without psychotic features: Secondary | ICD-10-CM | POA: Diagnosis not present

## 2016-11-07 DIAGNOSIS — W57XXXA Bitten or stung by nonvenomous insect and other nonvenomous arthropods, initial encounter: Secondary | ICD-10-CM | POA: Diagnosis not present

## 2016-11-07 DIAGNOSIS — S30861A Insect bite (nonvenomous) of abdominal wall, initial encounter: Secondary | ICD-10-CM | POA: Diagnosis not present

## 2016-11-07 DIAGNOSIS — D171 Benign lipomatous neoplasm of skin and subcutaneous tissue of trunk: Secondary | ICD-10-CM | POA: Diagnosis not present

## 2016-11-07 DIAGNOSIS — R490 Dysphonia: Secondary | ICD-10-CM | POA: Diagnosis not present

## 2016-11-07 DIAGNOSIS — D489 Neoplasm of uncertain behavior, unspecified: Secondary | ICD-10-CM | POA: Diagnosis not present

## 2016-11-07 NOTE — Assessment & Plan Note (Signed)
Continue current regimen, await Psychiatry consult next week.

## 2016-11-07 NOTE — Progress Notes (Signed)
BP 126/86   Pulse 64   Temp 97.9 F (36.6 C)   Wt 191 lb (86.6 kg)   SpO2 98%   BMI 25.20 kg/m    Subjective:    Patient ID: Eric Lloyd, male    DOB: 1960-09-27, 56 y.o.   MRN: [id number]  HPI: Eric Lloyd is a 56 y.o. male  Chief Complaint  Patient presents with  . Anxiety  . Depression  . Nodules    He was told in [place] that he had nodules in his throat.   . Abcess    on his lower back for a couple years, not getting worse. doesn't bother him.   Patient presents for follow up since restarting his depakote and latuda several weeks ago. Doing much better than he was while off the medications, but still having some lingering issues. Has psych appt feb 28th.   Also notes that he was told in [place] that he had nodules in his throat, and when he speaks for any length of time he becomes easily hoarse. Denies pain, dysphagia, or hematemesis.   Wife would like a mass on his low back looked at. Has been there for several years, possibly fluctuating in size. Never painful, not bothersome to him. No redness, drainage, tenderness in area.   Also wanting referral to dermatology for a skin check as some of his moles concern him. One in particular on his LLQ has been red and itching the past week or so and he would like it examined.   Depression screen [facility] 2/9 11/07/2016 10/16/2016  Decreased Interest 3 1  Down, Depressed, Hopeless 2 3  PHQ - 2 Score 5 4  Altered sleeping 3 3  Tired, decreased energy 2 0  Change in appetite 3 3  Feeling bad or failure about yourself  3 3  Trouble concentrating 2 3  Moving slowly or fidgety/restless 2 3  Suicidal thoughts 1 1  PHQ-9 Score 21 20   GAD 7 : Generalized Anxiety Score 11/07/2016 10/16/2016  Nervous, Anxious, on Edge 3 3  Control/stop worrying 2 3  Worry too much - different things 3 3  Trouble relaxing 2 3  Restless 3 3  Easily annoyed or irritable 0 0  Afraid - awful might happen 3 3  Total GAD 7 Score 16 18   Past Medical  History:  Diagnosis Date  . Anxiety   . Arthritis   . 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 Depression   . Diverticulitis    Social History   Social History  . Marital status: Single    Spouse name: N/A  . Number of children: N/A  . Years of education: N/A   Occupational History  . Not on file.   Social History Main Topics  . Smoking status: Never Smoker  . Smokeless tobacco: Never Used  . Alcohol use No  . Drug use: No  . Sexual activity: Not on file   Other Topics Concern  . Not on file   Social History Narrative  . No narrative on file    Relevant past medical, surgical, family and social history reviewed and updated as indicated. Interim medical history since our last visit reviewed. Allergies and medications reviewed and updated.  Review of Systems  Constitutional: Negative.   HENT: Positive for voice change.   Respiratory: Negative.   Gastrointestinal: Negative.   Genitourinary: Negative.   Musculoskeletal: Negative.   Skin:       Concerning moles Mass on  back  Neurological: Negative.   Psychiatric/Behavioral: Positive for dysphoric mood.    Per HPI unless specifically indicated above     Objective:    BP 126/86   Pulse 64   Temp 97.9 F (36.6 C)   Wt 191 lb (86.6 kg)   SpO2 98%   BMI 25.20 kg/m   Wt Readings from Last 3 Encounters:  11/07/16 191 lb (86.6 kg)  10/16/16 184 lb (83.5 kg)  09/27/16 185 lb (83.9 kg)    Physical Exam  Constitutional: He is oriented to person, place, and time. He appears well-developed and well-nourished. No distress.  HENT:  Head: Atraumatic.  Eyes: Conjunctivae are normal. Pupils are equal, round, and reactive to light.  Neck: Normal range of motion. Neck supple.  Cardiovascular: Normal rate and normal heart sounds.   Pulmonary/Chest: Effort normal and breath sounds normal. No respiratory distress.  Musculoskeletal: Normal range of motion.  Neurological: He is alert and oriented to person, place, and time.    Skin: Skin is warm and dry.  Live tick present and embedded in LLQ of abdomen with some surrounding erythema  Lipomatous mass midline lumbar region of back  Psychiatric: He has a normal mood and affect. His behavior is normal.  Nursing note and vitals reviewed.     Assessment & Plan:   Problem List Items Addressed This Visit      Other   Depression - Primary    Continue current regimen, await Psychiatry consult next week.        Other Visit Diagnoses    Tick bite of abdomen, initial encounter       Tick removed today with tweezers. Area was cleaned and dressed with antibiotic ointment. Wound care discussed, return precautions given   Hoarseness       Referral to ENT placed per patient request   Relevant Orders   Ambulatory referral to ENT   Neoplasm of uncertain behavior       Referral to Dermatology sent for full skin check   Relevant Orders   Ambulatory referral to Dermatology   Lipoma of torso       Discussed to monitor for size changes or bothersome features, but that it is fine to leave it alone if not bothersome.        Follow up plan: Return in about 3 months (around 02/04/2017) for Physical Exam.

## 2016-11-07 NOTE — Patient Instructions (Signed)
Follow up for Physical Exam 

## 2016-11-10 ENCOUNTER — Telehealth: Payer: Self-pay | Admitting: *Deleted

## 2016-11-10 NOTE — Telephone Encounter (Signed)
Received fax from [facility] [place] [phone]) regarding the records we requested on patient. They stated they do not retain medical records longer than 6 years after client has been discharged.

## 2016-11-17 ENCOUNTER — Ambulatory Visit: Payer: Medicare HMO | Admitting: Gastroenterology

## 2016-11-17 NOTE — Progress Notes (Deleted)
He has been referred for abdominal pain. He was seen at the Er on 09/28/16 with abdominal pain . He was treated for constipation and discharged. He says he has had an episode of diverticulitis 2 years back in [place]. Last colonoscopy was 2 years back and they found polyps.  Abdominal pain: Onset: *** Site :*** Radiation: *** Severity :*** Nature of pain: *** Aggravating factors: *** Relieving factors :*** Weight loss: *** NSAID use: *** PPI use :*** Gall bladder surgery: *** Frequency of bowel movements: *** Change in bowel movements: *** Relief with bowel movements: *** Gas/Bloating/Abdominal distension: ***    \Eric Lloyd 56 y.o. male has been referred for abdominal pain.

## 2016-12-10 ENCOUNTER — Ambulatory Visit (INDEPENDENT_AMBULATORY_CARE_PROVIDER_SITE_OTHER): Payer: Medicare Other | Admitting: Gastroenterology

## 2016-12-10 ENCOUNTER — Encounter: Payer: Self-pay | Admitting: Gastroenterology

## 2016-12-10 ENCOUNTER — Telehealth: Payer: Self-pay

## 2016-12-10 ENCOUNTER — Other Ambulatory Visit: Payer: Self-pay

## 2016-12-10 VITALS — BP 114/80 | HR 77 | Temp 98.3°F | Ht 73.0 in | Wt 194.0 lb

## 2016-12-10 DIAGNOSIS — K5900 Constipation, unspecified: Secondary | ICD-10-CM

## 2016-12-10 DIAGNOSIS — Z791 Long term (current) use of non-steroidal anti-inflammatories (NSAID): Secondary | ICD-10-CM

## 2016-12-10 DIAGNOSIS — R109 Unspecified abdominal pain: Secondary | ICD-10-CM

## 2016-12-10 MED ORDER — LINACLOTIDE 290 MCG PO CAPS: 290.0000 ug | ORAL_CAPSULE | Freq: Every day | ORAL | 0 refills | Status: DC

## 2016-12-10 MED ORDER — MAGNESIUM CITRATE PO SOLN: 1.0000 | Freq: Once | ORAL | 0 refills | Status: AC

## 2016-12-10 MED ORDER — OMEPRAZOLE 40 MG PO CPDR: 40.0000 mg | DELAYED_RELEASE_CAPSULE | Freq: Every day | ORAL | 3 refills | Status: AC

## 2016-12-10 NOTE — Progress Notes (Signed)
Gastroenterology Consultation  Referring Provider:     Valerie Roys, DO Primary Care Physician:  Eric Liter, DO Primary Gastroenterologist:  Dr. Jonathon Lloyd  Reason for Consultation:     Abdominal pain         HPI:   Eric Lloyd is a 56 y.o. y/o male referred for consultation & management  by Dr. Park Liter, DO.    He has been referred for abdominal pain.He was seen in the ER in 09/2016 . He was given a GI cocktail and discharged , history of constipation . CBC,BMP,LFT,TSH-normal   Abdominal pain: Onset: 1 year, getting worse, on and off , every other day , each episode lasts 2-3 hours Site :points to the umbilicus  Radiation: localized Petra Kuba of pain: squeeze  Aggravating factors: when he eats  Relieving factors :bowel movements  Weight loss: 15 lbs over 8 months ( our records suggests he has gained weight since 08/2016) NSAID use: advil , every other day for many months  PPI use :omeprazole - helps  Gall bladder surgery: no  Frequency of bowel movements: once every day , very hard some days he has none Change in bowel movements: 1 year  Relief with bowel movements: yes  Gas/Bloating/Abdominal distension: yes  Last colonoscopy was 2016 in [place] and says he had polyps. Tried his wifes linzess ?dose - worked.   BP 114/80 (BP Location: Left Arm, Patient Position: Sitting, Cuff Size: Large)   Pulse 77   Temp 98.3 F (36.8 C) (Oral)   Ht 6\' 1"  (1.854 m)   Wt 194 lb (88 kg)   BMI 25.60 kg/m    Past Medical History:  Diagnosis Date  . Anxiety   . Arthritis   . 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 Depression   . Diverticulitis     No past surgical history on file.  Prior to Admission medications   Medication Sig Start Date End Date Taking? Authorizing Provider  albuterol (PROVENTIL HFA;VENTOLIN HFA) 108 (90 Base) MCG/ACT inhaler Inhale 2 puffs into the lungs every 4 (four) hours as needed for wheezing or shortness of breath. 09/02/16   Eric Bills Cuthriell,  PA-C  ARIPiprazole (ABILIFY) 10 MG tablet Take 1 tablet (10 mg total) by mouth daily. 10/16/16   Eric P Johnson, DO  cetirizine (ZYRTEC) 10 MG tablet Take 1 tablet (10 mg total) by mouth daily. 09/02/16   Eric Bills Cuthriell, PA-C  cyclobenzaprine (FLEXERIL) 10 MG tablet Take 1 tablet (10 mg total) by mouth at bedtime. 10/16/16   Eric P Johnson, DO  divalproex (DEPAKOTE) 500 MG DR tablet Take 1 tablet (500 mg total) by mouth 2 (two) times daily. 10/16/16   Eric P Johnson, DO  docusate sodium (COLACE) 100 MG capsule Take 1 capsule (100 mg total) by mouth daily. 09/28/16 09/28/17  Eric Hering, MD  fluticasone (FLONASE) 50 MCG/ACT nasal spray Place 1 spray into both nostrils 2 (two) times daily. 09/02/16   Eric Bills Cuthriell, PA-C  polyethylene glycol (MIRALAX) packet Take 17 g by mouth daily. 09/28/16   Eric Hering, MD    Family History  Problem Relation Age of Onset  . Diabetes Mother   . Hypertension Mother   . Cancer Mother   . Diabetes Brother      Social History  Substance Use Topics  . Smoking status: Never Smoker  . Smokeless tobacco: Never Used  . Alcohol use No    Allergies as of 12/10/2016  . (No Known  Allergies)    Review of Systems:    All systems reviewed and negative except where noted in HPI.   Physical Exam:  There were no vitals taken for this visit. No LMP for male patient. Psych:  Alert and cooperative. Normal mood and affect. General:   Alert,  Well-developed, well-nourished, pleasant and cooperative in NAD Head:  Normocephalic and atraumatic. Eyes:  Sclera clear, no icterus.   Conjunctiva pink. Ears:  Normal auditory acuity. Nose:  No deformity, discharge, or lesions. Mouth:  No deformity or lesions,oropharynx pink & moist. Neck:  Supple; no masses or thyromegaly. Lungs:  Respirations even and unlabored.  Clear throughout to auscultation.   No wheezes, crackles, or rhonchi. No acute distress. Heart:  Regular rate and rhythm; no murmurs, clicks,  rubs, or gallops. Abdomen:  Normal bowel sounds.  No bruits.  Soft, non-tender and non-distended without masses, hepatosplenomegaly or hernias noted.  No guarding or rebound tenderness.    Msk:  Symmetrical without gross deformities. Good, equal movement & strength bilaterally. Pulses:  Normal pulses noted. Extremities:  No clubbing or edema.  No cyanosis. Neurologic:  Alert and oriented x3;  grossly normal neurologically. Psych:  Alert and cooperative. Normal mood and affect.  Imaging Studies: No results found.  Assessment and Plan:   Eric Lloyd is a 56 y.o. y/o male has been referred for abdominal pain, history suggestive of severe constipation. History of long term NSAID use. My impression is that the pain is secondary to severe constipation and probably an element from NSAID related injury   Plan  1. Stop all NSAID's 2. EGD+ colonoscopy to evaluate abdominal pain and change in bowel habits.  3. Magnesium citrate clean out of his colon today followed with commencement of linzess from tomorrow.  4. High fiber diet  5. Stool for H pylori.  6. Omeprazole 40 mg once daily.   Follow up in 2 months/   Dr Eric Bellows MD

## 2016-12-10 NOTE — Telephone Encounter (Signed)
Gastroenterology Pre-Procedure Review  Request Date: 01/06/17 Requesting Physician: Dr. Vicente Males  PATIENT REVIEW QUESTIONS: The patient responded to the following health history questions as indicated:    1. Are you having any GI issues? yes (constipation) 2. Do you have a personal history of Polyps? yes (removed) 3. Do you have a family history of Colon Cancer or Polyps? no 4. Diabetes Mellitus? no 5. Joint replacements in the past 12 months?no 6. Major health problems in the past 3 months?no 7. Any artificial heart valves, MVP, or defibrillator?no    MEDICATIONS & ALLERGIES:    Patient reports the following regarding taking any anticoagulation/antiplatelet therapy:   Plavix, Coumadin, Eliquis, Xarelto, Lovenox, Pradaxa, Brilinta, or Effient? no Aspirin? no  Patient confirms/reports the following medications:  Current Outpatient Prescriptions  Medication Sig Dispense Refill  . albuterol (PROVENTIL HFA;VENTOLIN HFA) 108 (90 Base) MCG/ACT inhaler Inhale 2 puffs into the lungs every 4 (four) hours as needed for wheezing or shortness of breath. 1 Inhaler 0  . ARIPiprazole (ABILIFY) 10 MG tablet Take 1 tablet (10 mg total) by mouth daily. 30 tablet 3  . cetirizine (ZYRTEC) 10 MG tablet Take 1 tablet (10 mg total) by mouth daily. 30 tablet 0  . cyclobenzaprine (FLEXERIL) 10 MG tablet Take 1 tablet (10 mg total) by mouth at bedtime. 30 tablet 0  . divalproex (DEPAKOTE) 500 MG DR tablet Take 1 tablet (500 mg total) by mouth 2 (two) times daily. 60 tablet 3  . docusate sodium (COLACE) 100 MG capsule Take 1 capsule (100 mg total) by mouth daily. 15 capsule 0  . fluticasone (FLONASE) 50 MCG/ACT nasal spray Place 1 spray into both nostrils 2 (two) times daily. 16 g 0  . linaclotide (LINZESS) 290 MCG CAPS capsule Take 1 capsule (290 mcg total) by mouth daily before breakfast. 61 capsule 0  . magnesium citrate SOLN Take 296 mLs (1 Bottle total) by mouth once. 195 mL 0  . omeprazole (PRILOSEC) 40 MG  capsule Take 1 capsule (40 mg total) by mouth daily. 90 capsule 3  . polyethylene glycol (MIRALAX) packet Take 17 g by mouth daily. 14 each 0   No current facility-administered medications for this visit.     Patient confirms/reports the following allergies:  No Known Allergies  No orders of the defined types were placed in this encounter.   AUTHORIZATION INFORMATION Primary Insurance: 1D#: Group #:  Secondary Insurance: 1D#: Group #:  SCHEDULE INFORMATION: Date: 01/06/17 Time: Location: [place]

## 2016-12-11 DIAGNOSIS — K219 Gastro-esophageal reflux disease without esophagitis: Secondary | ICD-10-CM | POA: Diagnosis not present

## 2016-12-11 DIAGNOSIS — H903 Sensorineural hearing loss, bilateral: Secondary | ICD-10-CM | POA: Diagnosis not present

## 2016-12-11 DIAGNOSIS — R490 Dysphonia: Secondary | ICD-10-CM | POA: Diagnosis not present

## 2016-12-16 ENCOUNTER — Telehealth: Payer: Self-pay | Admitting: Gastroenterology

## 2016-12-16 NOTE — Telephone Encounter (Signed)
12/16/16 Per Helene Kelp prior auth required for Colonoscopy or EGD. Constipation K59.00, Abd pain R10.9 and Long term NSAID use Z79.11.

## 2016-12-16 NOTE — Telephone Encounter (Signed)
Altha Harm with [facility]

## 2016-12-23 ENCOUNTER — Other Ambulatory Visit
Admission: RE | Admit: 2016-12-23 | Discharge: 2016-12-23 | Disposition: A | Discharge status: Home / Self Care | Payer: Medicare Other | Source: Ambulatory Visit | Attending: Gastroenterology | Admitting: Gastroenterology

## 2016-12-23 DIAGNOSIS — R109 Unspecified abdominal pain: Secondary | ICD-10-CM | POA: Insufficient documentation

## 2016-12-24 LAB — H. PYLORI ANTIGEN, STOOL: H. PYLORI STOOL AG, EIA: POSITIVE — AB

## 2016-12-25 NOTE — Progress Notes (Signed)
H pylori in stool is positive .   Suggest clarithromycin 500 mg PO BID, amoxicillin 1 gram TID, omeprazole 20 mg BID all for 14 days.  , check for penicillin allergy and will need repeat H pylori stool antigen to check for eradication after .

## 2016-12-26 ENCOUNTER — Other Ambulatory Visit: Payer: Self-pay

## 2016-12-26 DIAGNOSIS — A048 Other specified bacterial intestinal infections: Secondary | ICD-10-CM

## 2016-12-26 MED ORDER — AMOXICILLIN 500 MG PO CAPS: 1000.0000 mg | ORAL_CAPSULE | Freq: Three times a day (TID) | ORAL | 0 refills | Status: AC

## 2016-12-26 MED ORDER — CLARITHROMYCIN 250 MG/5ML PO SUSR: 500.0000 mg | Freq: Two times a day (BID) | ORAL | Status: DC

## 2017-01-05 ENCOUNTER — Encounter: Payer: Self-pay | Admitting: *Deleted

## 2017-01-06 ENCOUNTER — Ambulatory Visit: Payer: Medicare Other | Admitting: Registered Nurse

## 2017-01-06 ENCOUNTER — Encounter: Payer: Self-pay | Admitting: *Deleted

## 2017-01-06 ENCOUNTER — Encounter
Admission: RE | Disposition: A | Discharge status: Home / Self Care | Payer: Self-pay | Source: Ambulatory Visit | Attending: Gastroenterology

## 2017-01-06 ENCOUNTER — Ambulatory Visit
Admission: RE | Admit: 2017-01-06 | Discharge: 2017-01-06 | Disposition: A | Discharge status: Home / Self Care | Payer: Medicare Other | Source: Ambulatory Visit | Attending: Gastroenterology | Admitting: Gastroenterology

## 2017-01-06 DIAGNOSIS — K219 Gastro-esophageal reflux disease without esophagitis: Secondary | ICD-10-CM | POA: Insufficient documentation

## 2017-01-06 DIAGNOSIS — K640 First degree hemorrhoids: Secondary | ICD-10-CM | POA: Diagnosis not present

## 2017-01-06 DIAGNOSIS — K5900 Constipation, unspecified: Secondary | ICD-10-CM

## 2017-01-06 DIAGNOSIS — R194 Change in bowel habit: Secondary | ICD-10-CM | POA: Diagnosis not present

## 2017-01-06 DIAGNOSIS — R933 Abnormal findings on diagnostic imaging of other parts of digestive tract: Secondary | ICD-10-CM | POA: Diagnosis not present

## 2017-01-06 DIAGNOSIS — K635 Polyp of colon: Secondary | ICD-10-CM | POA: Diagnosis not present

## 2017-01-06 DIAGNOSIS — K5730 Diverticulosis of large intestine without perforation or abscess without bleeding: Secondary | ICD-10-CM | POA: Insufficient documentation

## 2017-01-06 DIAGNOSIS — K639 Disease of intestine, unspecified: Secondary | ICD-10-CM | POA: Diagnosis not present

## 2017-01-06 DIAGNOSIS — K6389 Other specified diseases of intestine: Secondary | ICD-10-CM | POA: Diagnosis not present

## 2017-01-06 DIAGNOSIS — B9681 Helicobacter pylori [H. pylori] as the cause of diseases classified elsewhere: Secondary | ICD-10-CM | POA: Insufficient documentation

## 2017-01-06 DIAGNOSIS — R109 Unspecified abdominal pain: Secondary | ICD-10-CM | POA: Insufficient documentation

## 2017-01-06 DIAGNOSIS — K648 Other hemorrhoids: Secondary | ICD-10-CM | POA: Diagnosis not present

## 2017-01-06 DIAGNOSIS — R1013 Epigastric pain: Secondary | ICD-10-CM

## 2017-01-06 DIAGNOSIS — K2970 Gastritis, unspecified, without bleeding: Secondary | ICD-10-CM | POA: Insufficient documentation

## 2017-01-06 DIAGNOSIS — Z79899 Other long term (current) drug therapy: Secondary | ICD-10-CM | POA: Insufficient documentation

## 2017-01-06 DIAGNOSIS — K5790 Diverticulosis of intestine, part unspecified, without perforation or abscess without bleeding: Secondary | ICD-10-CM | POA: Diagnosis not present

## 2017-01-06 DIAGNOSIS — K2950 Unspecified chronic gastritis without bleeding: Secondary | ICD-10-CM | POA: Diagnosis not present

## 2017-01-06 HISTORY — PX: ESOPHAGOGASTRODUODENOSCOPY (EGD) WITH PROPOFOL: SHX5813

## 2017-01-06 HISTORY — PX: COLONOSCOPY WITH PROPOFOL: SHX5780

## 2017-01-06 SURGERY — COLONOSCOPY WITH PROPOFOL
Anesthesia: General

## 2017-01-06 MED ORDER — ACETAMINOPHEN 500 MG PO TABS: 500.0000 mg | ORAL_TABLET | Freq: Four times a day (QID) | ORAL | Status: DC | PRN

## 2017-01-06 MED ORDER — ACETAMINOPHEN 500 MG PO TABS: 1000.0000 mg | ORAL_TABLET | Freq: Four times a day (QID) | ORAL | Status: DC | PRN

## 2017-01-06 MED ORDER — MIDAZOLAM HCL 2 MG/2ML IJ SOLN
INTRAMUSCULAR | Status: DC | PRN
  Administered 2017-01-06: 1 mg via INTRAVENOUS

## 2017-01-06 MED ORDER — SODIUM CHLORIDE 0.9 % IV SOLN
INTRAVENOUS | Status: DC
  Administered 2017-01-06: 1000 mL via INTRAVENOUS

## 2017-01-06 MED ORDER — MIDAZOLAM HCL 2 MG/2ML IJ SOLN
INTRAMUSCULAR | Status: AC
  Filled 2017-01-06: qty 2

## 2017-01-06 MED ORDER — ACETAMINOPHEN 500 MG PO TABS
ORAL_TABLET | ORAL | Status: AC
  Administered 2017-01-06: 1000 mg via ORAL
  Filled 2017-01-06: qty 2

## 2017-01-06 MED ORDER — GLYCOPYRROLATE 0.2 MG/ML IJ SOLN
INTRAMUSCULAR | Status: AC
  Filled 2017-01-06: qty 1

## 2017-01-06 MED ORDER — LIDOCAINE HCL (PF) 2 % IJ SOLN
INTRAMUSCULAR | Status: AC
  Filled 2017-01-06: qty 2

## 2017-01-06 MED ORDER — LIDOCAINE HCL (CARDIAC) 20 MG/ML IV SOLN
INTRAVENOUS | Status: DC | PRN
  Administered 2017-01-06: 40 mg via INTRAVENOUS

## 2017-01-06 MED ORDER — GLYCOPYRROLATE 0.2 MG/ML IJ SOLN
INTRAMUSCULAR | Status: DC | PRN
  Administered 2017-01-06: .2 mg via INTRAVENOUS

## 2017-01-06 MED ORDER — PROPOFOL 500 MG/50ML IV EMUL
INTRAVENOUS | Status: AC
  Filled 2017-01-06: qty 50

## 2017-01-06 MED ORDER — PROPOFOL 500 MG/50ML IV EMUL
INTRAVENOUS | Status: DC | PRN
  Administered 2017-01-06: 140 ug/kg/min via INTRAVENOUS

## 2017-01-06 MED ORDER — PROPOFOL 10 MG/ML IV BOLUS
INTRAVENOUS | Status: DC | PRN
  Administered 2017-01-06: 20 mg via INTRAVENOUS
  Administered 2017-01-06: 70 mg via INTRAVENOUS

## 2017-01-06 MED ORDER — FENTANYL CITRATE (PF) 100 MCG/2ML IJ SOLN
INTRAMUSCULAR | Status: AC
  Filled 2017-01-06: qty 2

## 2017-01-06 MED ORDER — FENTANYL CITRATE (PF) 100 MCG/2ML IJ SOLN
INTRAMUSCULAR | Status: DC | PRN
  Administered 2017-01-06: 50 ug via INTRAVENOUS

## 2017-01-06 NOTE — Op Note (Signed)
[facility] Gastroenterology Patient Name: Eric Lloyd Procedure Date: 01/06/2017 9:29 AM MRN: [id number] Account #: [id number] Date of Birth: [birth year] Admit Type: Outpatient Age: 56 Room: [facility] ENDO ROOM 1 Gender: Male Note Status: Finalized Procedure:            Upper GI endoscopy Indications:          Epigastric abdominal pain Providers:            Jonathon Bellows MD, MD Referring MD:         Valerie Roys (Referring MD) Medicines:            Monitored Anesthesia Care Complications:        No immediate complications. Procedure:            Pre-Anesthesia Assessment:                       - Prior to the procedure, a History and Physical was                        performed, and patient medications, allergies and                        sensitivities were reviewed. The patient's tolerance of                        previous anesthesia was reviewed.                       - The risks and benefits of the procedure and the                        sedation options and risks were discussed with the                        patient. All questions were answered and informed                        consent was obtained.                       - ASA Grade Assessment: II - A patient with mild                        systemic disease.                       After obtaining informed consent, the endoscope was                        passed under direct vision. Throughout the procedure,                        the patient's blood pressure, pulse, and oxygen                        saturations were monitored continuously. The Endoscope                        was introduced through the mouth, and advanced to the  third part of duodenum. The upper GI endoscopy was                        accomplished with ease. The patient tolerated the                        procedure well. Findings:      The esophagus was normal.      The examined duodenum was normal.      Diffuse  moderate inflammation characterized by congestion (edema) and       erythema was found in the gastric antrum. Biopsies were taken with a       cold forceps for histology. Impression:           - Normal esophagus.                       - Normal examined duodenum.                       - Gastritis. Biopsied. Recommendation:       - Await pathology results.                       - Perform a colonoscopy today. Procedure Code(s):    --- Professional ---                       [phone], Esophagogastroduodenoscopy, flexible, transoral;                        with biopsy, single or multiple Diagnosis Code(s):    --- Professional ---                       K29.70, Gastritis, unspecified, without bleeding                       R10.13, Epigastric pain CPT copyright 2016 American Medical Association. All rights reserved. The codes documented in this report are preliminary and upon coder review may  be revised to meet current compliance requirements. Jonathon Bellows, MD Jonathon Bellows MD, MD 01/06/2017 9:41:57 AM This report has been signed electronically. Number of Addenda: 0 Note Initiated On: 01/06/2017 9:29 AM      [facility]

## 2017-01-06 NOTE — Anesthesia Postprocedure Evaluation (Signed)
Anesthesia Post Note  Patient: Eric Lloyd  Procedure(s) Performed: Procedure(s) (LRB): COLONOSCOPY WITH PROPOFOL (N/A) ESOPHAGOGASTRODUODENOSCOPY (EGD) WITH PROPOFOL (N/A)  Patient location during evaluation: Endoscopy Anesthesia Type: General Level of consciousness: awake and alert Pain management: pain level controlled Vital Signs Assessment: post-procedure vital signs reviewed and stable Respiratory status: spontaneous breathing, nonlabored ventilation, respiratory function stable and patient connected to nasal cannula oxygen Cardiovascular status: blood pressure returned to baseline and stable Postop Assessment: no signs of nausea or vomiting Anesthetic complications: no     Last Vitals:  Vitals:   01/06/17 1030 01/06/17 1040  BP: (!) 111/98 124/86  Pulse: 65 65  Resp: 13 14  Temp:      Last Pain:  Vitals:   01/06/17 1001  TempSrc: Tympanic                 Precious Haws Piscitello

## 2017-01-06 NOTE — Anesthesia Preprocedure Evaluation (Signed)
Anesthesia Evaluation  Patient identified by MRN, date of birth, ID band Patient awake    Reviewed: Allergy & Precautions, H&P , NPO status , Patient's Chart, lab work & pertinent test results  History of Anesthesia Complications Negative for: history of anesthetic complications  Airway Mallampati: III  TM Distance: >3 FB Neck ROM: full    Dental  (+) Poor Dentition, Chipped, Caps   Pulmonary neg pulmonary ROS, neg shortness of breath,    Pulmonary exam normal breath sounds clear to auscultation       Cardiovascular Exercise Tolerance: Good (-) angina(-) Past MI and (-) DOE negative cardio ROS Normal cardiovascular exam Rhythm:regular Rate:Normal     Neuro/Psych PSYCHIATRIC DISORDERS Anxiety Depression negative neurological ROS     GI/Hepatic Neg liver ROS, GERD  Medicated and Controlled,  Endo/Other  negative endocrine ROS  Renal/GU negative Renal ROS  negative genitourinary   Musculoskeletal  (+) Arthritis ,   Abdominal   Peds  Hematology negative hematology ROS (+)   Anesthesia Other Findings Past Medical History: No date: Anxiety No date: Arthritis No date: 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) No date: Depression No date: Diverticulitis  Past Surgical History: No date: NO PAST SURGERIES  BMI    Body Mass Index:  25.80 kg/m      Reproductive/Obstetrics negative OB ROS                             Anesthesia Physical Anesthesia Plan  ASA: III  Anesthesia Plan: General   Post-op Pain Management:    Induction: Intravenous  Airway Management Planned: Natural Airway  Additional Equipment:   Intra-op Plan:   Post-operative Plan:   Informed Consent: I have reviewed the patients History and Physical, chart, labs and discussed the procedure including the risks, benefits and alternatives for the proposed anesthesia with the patient or authorized representative who has indicated  his/her understanding and acceptance.   Dental Advisory Given  Plan Discussed with: Anesthesiologist, CRNA and Surgeon  Anesthesia Plan Comments: (Patient consented for risks of anesthesia including but not limited to:  - adverse reactions to medications - damage to teeth, lips or other oral mucosa - sore throat or hoarseness - Damage to heart, brain, lungs or loss of life  Patient voiced understanding.  Patient and wife decline interpreter )        Anesthesia Quick Evaluation

## 2017-01-06 NOTE — Anesthesia Post-op Follow-up Note (Cosign Needed)
Anesthesia QCDR form completed.        

## 2017-01-06 NOTE — H&P (Signed)
Jonathon Bellows MD [address] [place], [place] [address] Phone: [phone] Fax : [phone]  Primary Care Physician:  Park Liter, DO Primary Gastroenterologist:  Dr. Jonathon Bellows   Pre-Procedure History & Physical: HPI:  Abdoulie Tierce is a 56 y.o. male is here for an endoscopy and colonoscopy.   Past Medical History:  Diagnosis Date  . Anxiety   . Arthritis   . 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 Depression   . Diverticulitis     Past Surgical History:  Procedure Laterality Date  . NO PAST SURGERIES      Prior to Admission medications   Medication Sig Start Date End Date Taking? Authorizing Provider  albuterol (PROVENTIL HFA;VENTOLIN HFA) 108 (90 Base) MCG/ACT inhaler Inhale 2 puffs into the lungs every 4 (four) hours as needed for wheezing or shortness of breath. 09/02/16   Charline Bills Cuthriell, PA-C  amoxicillin (AMOXIL) 500 MG capsule Take 2 capsules (1,000 mg total) by mouth 3 (three) times daily. 12/26/16 01/09/17  Jonathon Bellows, MD  ARIPiprazole (ABILIFY) 10 MG tablet Take 1 tablet (10 mg total) by mouth daily. 10/16/16   Megan P Johnson, DO  cetirizine (ZYRTEC) 10 MG tablet Take 1 tablet (10 mg total) by mouth daily. 09/02/16   Charline Bills Cuthriell, PA-C  cyclobenzaprine (FLEXERIL) 10 MG tablet Take 1 tablet (10 mg total) by mouth at bedtime. 10/16/16   Megan P Johnson, DO  divalproex (DEPAKOTE) 500 MG DR tablet Take 1 tablet (500 mg total) by mouth 2 (two) times daily. 10/16/16   Megan P Johnson, DO  docusate sodium (COLACE) 100 MG capsule Take 1 capsule (100 mg total) by mouth daily. 09/28/16 09/28/17  Loney Hering, MD  linaclotide [facility]) 290 MCG CAPS capsule Take 1 capsule (290 mcg total) by mouth daily before breakfast. 12/10/16 02/09/17  Jonathon Bellows, MD  omeprazole (PRILOSEC) 40 MG capsule Take 1 capsule (40 mg total) by mouth daily. 12/10/16   Jonathon Bellows, MD  polyethylene glycol [facility]) packet Take 17 g by mouth daily. 09/28/16   Loney Hering, MD    Allergies  as of 12/10/2016  . (No Known Allergies)    Family History  Problem Relation Age of Onset  . Diabetes Mother   . Hypertension Mother   . Cancer Mother   . Diabetes Brother     Social History   Social History  . Marital status: Single    Spouse name: N/A  . Number of children: N/A  . Years of education: N/A   Occupational History  . Not on file.   Social History Main Topics  . Smoking status: Never Smoker  . Smokeless tobacco: Never Used  . Alcohol use No  . Drug use: No  . Sexual activity: Not on file   Other Topics Concern  . Not on file   Social History Narrative  . No narrative on file    Review of Systems: See HPI, otherwise negative ROS  Physical Exam: BP 116/79   Pulse 71   Temp (!) 96.7 F (35.9 C) (Tympanic)   Resp 16   Ht 5\' 11"  (1.803 m)   Wt 185 lb (83.9 kg)   SpO2 100%   BMI 25.80 kg/m  General:   Alert,  pleasant and cooperative in NAD Head:  Normocephalic and atraumatic. Neck:  Supple; no masses or thyromegaly. Lungs:  Clear throughout to auscultation.    Heart:  Regular rate and rhythm. Abdomen:  Soft, nontender and nondistended. Normal bowel sounds,  without guarding, and without rebound.   Neurologic:  Alert and  oriented x4;  grossly normal neurologically.  Impression/Plan: Caid Radin is here for an endoscopy and colonoscopy to be performed for abdominal pain , change in bowel habits   Risks, benefits, limitations, and alternatives regarding  endoscopy and colonoscopy have been reviewed with the patient.  Questions have been answered.  All parties agreeable.   Jonathon Bellows, MD  01/06/2017, 9:28 AM

## 2017-01-06 NOTE — Anesthesia Procedure Notes (Signed)
Date/Time: 01/06/2017 9:33 AM Performed by: Hedda Slade Pre-anesthesia Checklist: Patient identified, Emergency Drugs available, Suction available and Patient being monitored Patient Re-evaluated:Patient Re-evaluated prior to inductionOxygen Delivery Method: Nasal cannula

## 2017-01-06 NOTE — Transfer of Care (Signed)
Immediate Anesthesia Transfer of Care Note  Patient: Eric Lloyd  Procedure(s) Performed: Procedure(s): COLONOSCOPY WITH PROPOFOL (N/A) ESOPHAGOGASTRODUODENOSCOPY (EGD) WITH PROPOFOL (N/A)  Patient Location: PACU  Anesthesia Type:General  Level of Consciousness: sedated  Airway & Oxygen Therapy: Patient Spontanous Breathing and Patient connected to nasal cannula oxygen  Post-op Assessment: Report given to RN and Post -op Vital signs reviewed and stable  Post vital signs: Reviewed and stable  Last Vitals:  Vitals:   01/06/17 1000 01/06/17 1001  BP:  107/74  Pulse: 76 77  Resp: 12   Temp:  36.8 C    Last Pain:  Vitals:   01/06/17 1001  TempSrc: Tympanic         Complications: No apparent anesthesia complications

## 2017-01-06 NOTE — Op Note (Signed)
[facility] Gastroenterology Patient Name: Eric Lloyd Procedure Date: 01/06/2017 9:29 AM MRN: [id number] Account #: [id number] Date of Birth: 06-Jul-1961 Admit Type: Outpatient Age: 56 Room: [facility] ENDO ROOM 1 Gender: Male Note Status: Finalized Procedure:            Colonoscopy Indications:          Change in bowel habits Providers:            Jonathon Bellows MD, MD Medicines:            Monitored Anesthesia Care Complications:        No immediate complications. Procedure:            Pre-Anesthesia Assessment:                       - Prior to the procedure, a History and Physical was                        performed, and patient medications, allergies and                        sensitivities were reviewed. The patient's tolerance of                        previous anesthesia was reviewed.                       - The risks and benefits of the procedure and the                        sedation options and risks were discussed with the                        patient. All questions were answered and informed                        consent was obtained.                       - ASA Grade Assessment: II - A patient with mild                        systemic disease.                       After obtaining informed consent, the colonoscope was                        passed under direct vision. Throughout the procedure,                        the patient's blood pressure, pulse, and oxygen                        saturations were monitored continuously. The                        Colonoscope was introduced through the anus and                        advanced to the the terminal ileum. The colonoscopy was  performed with ease. The patient tolerated the                        procedure well. The quality of the bowel preparation                        was good. Findings:      Multiple small-mouthed diverticula were found in the sigmoid colon.      Non-bleeding  internal hemorrhoids were found during retroflexion. The       hemorrhoids were small and Grade I (internal hemorrhoids that do not       prolapse).      The exam was otherwise without abnormality on direct and retroflexion       views.      A scattered area of mucosa in the terminal ileum was nodular. Biopsies       were taken with a cold forceps for histology.      The ileocecal valve contained a localized area of increased mucosa       vascular pattern. This was biopsied with a cold forceps for histology. Impression:           - Diverticulosis in the sigmoid colon.                       - Non-bleeding internal hemorrhoids.                       - The examination was otherwise normal on direct and                        retroflexion views.                       - Nodular ileal mucosa. Biopsied.                       - Increased mucosa vascular pattern at the ileocecal                        valve. Biopsied. Recommendation:       - Discharge patient to home (with escort).                       - Resume previous diet.                       - Continue present medications.                       - Await pathology results.                       - Repeat colonoscopy for surveillance based on                        pathology results.                       - Return to my office in 6 weeks. Procedure Code(s):    --- Professional ---                       [phone], Colonoscopy, flexible; with biopsy, single or  multiple Diagnosis Code(s):    --- Professional ---                       K64.0, First degree hemorrhoids                       K63.89, Other specified diseases of intestine                       R19.4, Change in bowel habit                       K57.30, Diverticulosis of large intestine without                        perforation or abscess without bleeding CPT copyright 2016 American Medical Association. All rights reserved. The codes documented in this report are  preliminary and upon coder review may  be revised to meet current compliance requirements. Jonathon Bellows, MD Jonathon Bellows MD, MD 01/06/2017 10:00:43 AM This report has been signed electronically. Number of Addenda: 0 Note Initiated On: 01/06/2017 9:29 AM Scope Withdrawal Time: 0 hours 11 minutes 56 seconds  Total Procedure Duration: 0 hours 13 minutes 52 seconds       [facility]

## 2017-01-07 ENCOUNTER — Encounter: Payer: Self-pay | Admitting: Gastroenterology

## 2017-01-07 LAB — SURGICAL PATHOLOGY

## 2017-01-16 ENCOUNTER — Telehealth: Payer: Self-pay

## 2017-01-16 DIAGNOSIS — A048 Other specified bacterial intestinal infections: Secondary | ICD-10-CM

## 2017-01-16 NOTE — Telephone Encounter (Signed)
Advised pt of Labs for H. Pylori

## 2017-01-28 ENCOUNTER — Other Ambulatory Visit: Payer: Self-pay | Admitting: Gastroenterology

## 2017-01-29 ENCOUNTER — Other Ambulatory Visit
Admission: RE | Admit: 2017-01-29 | Discharge: 2017-01-29 | Disposition: A | Discharge status: Home / Self Care | Payer: Medicare Other | Source: Ambulatory Visit | Attending: Gastroenterology | Admitting: Gastroenterology

## 2017-01-29 DIAGNOSIS — A048 Other specified bacterial intestinal infections: Secondary | ICD-10-CM | POA: Diagnosis not present

## 2017-01-31 LAB — H. PYLORI ANTIGEN, STOOL: H. PYLORI STOOL AG, EIA: NEGATIVE

## 2017-02-02 ENCOUNTER — Telehealth: Payer: Self-pay

## 2017-02-02 NOTE — Telephone Encounter (Signed)
Advised pt of h. Pylori results per Dr. Vicente Males.

## 2017-02-02 NOTE — Telephone Encounter (Signed)
-----   Message from Jonathon Bellows, MD sent at 02/02/2017 11:43 AM EDT ----- H pylori stool antigen negative

## 2017-02-06 ENCOUNTER — Ambulatory Visit: Payer: Medicare HMO | Admitting: Family Medicine

## 2017-02-10 ENCOUNTER — Ambulatory Visit: Payer: Medicare HMO | Admitting: Family Medicine

## 2017-02-11 ENCOUNTER — Ambulatory Visit: Payer: Medicare Other | Admitting: Gastroenterology

## 2017-02-11 NOTE — Progress Notes (Deleted)
  He is here today for a follow up visit for constipation , abdominal pain , H pylori infection, NSAID use and weight loss.  Summary of history :  He was initially seen on 12/10/16 fir 1 year history of abdominal pain , at the [facility], weight loss of 15 lbs over 8 months  Interval history   12/10/2016-  02/11/2017    H pylori stool antigen was positive 12/22/16- treated with triple therapy and rechecked stgool 01/29/17 and eradication confirmed. EGD with biopsies confirmed H pylori gastritis 01/06/17 .  Colonoscopy was normal except for diverticulosis of the colon.   NSAIDS  *** LINZESS *** Constipation *** Omeprazole *** Abdominal pain ***   Eric Lloyd is a 56 y.o. y/o male here today to follow up for H pylori gastritis, S/p eradication with antibiotics, Chronic constipation, long term use of NSAID's.    Plan  1. Repeat colonoscopy in 5 years for surveillance 2. Avoid NSAID's

## 2017-02-22 DIAGNOSIS — R1013 Epigastric pain: Secondary | ICD-10-CM | POA: Diagnosis not present

## 2017-02-22 DIAGNOSIS — R030 Elevated blood-pressure reading, without diagnosis of hypertension: Secondary | ICD-10-CM | POA: Diagnosis not present

## 2017-02-22 DIAGNOSIS — K5730 Diverticulosis of large intestine without perforation or abscess without bleeding: Secondary | ICD-10-CM | POA: Diagnosis not present

## 2017-02-22 DIAGNOSIS — M6208 Separation of muscle (nontraumatic), other site: Secondary | ICD-10-CM | POA: Diagnosis not present

## 2017-03-10 DIAGNOSIS — K5730 Diverticulosis of large intestine without perforation or abscess without bleeding: Secondary | ICD-10-CM | POA: Diagnosis not present

## 2017-03-10 DIAGNOSIS — D291 Benign neoplasm of prostate: Secondary | ICD-10-CM | POA: Diagnosis not present

## 2017-03-10 DIAGNOSIS — R109 Unspecified abdominal pain: Secondary | ICD-10-CM | POA: Diagnosis not present

## 2017-03-10 DIAGNOSIS — Z79899 Other long term (current) drug therapy: Secondary | ICD-10-CM | POA: Diagnosis not present

## 2017-03-10 DIAGNOSIS — K2970 Gastritis, unspecified, without bleeding: Secondary | ICD-10-CM | POA: Diagnosis not present

## 2017-04-16 IMAGING — CR DG ABDOMEN 1V
2 series · 2 of 2 positions shown · non-contrast
Comparison: None.

CLINICAL DATA: Chronic abdominal pain for 1 year. Umbilical pain
today. History of constipation.

EXAM:
ABDOMEN - 1 VIEW

[abdomen kub (1 of 2)]
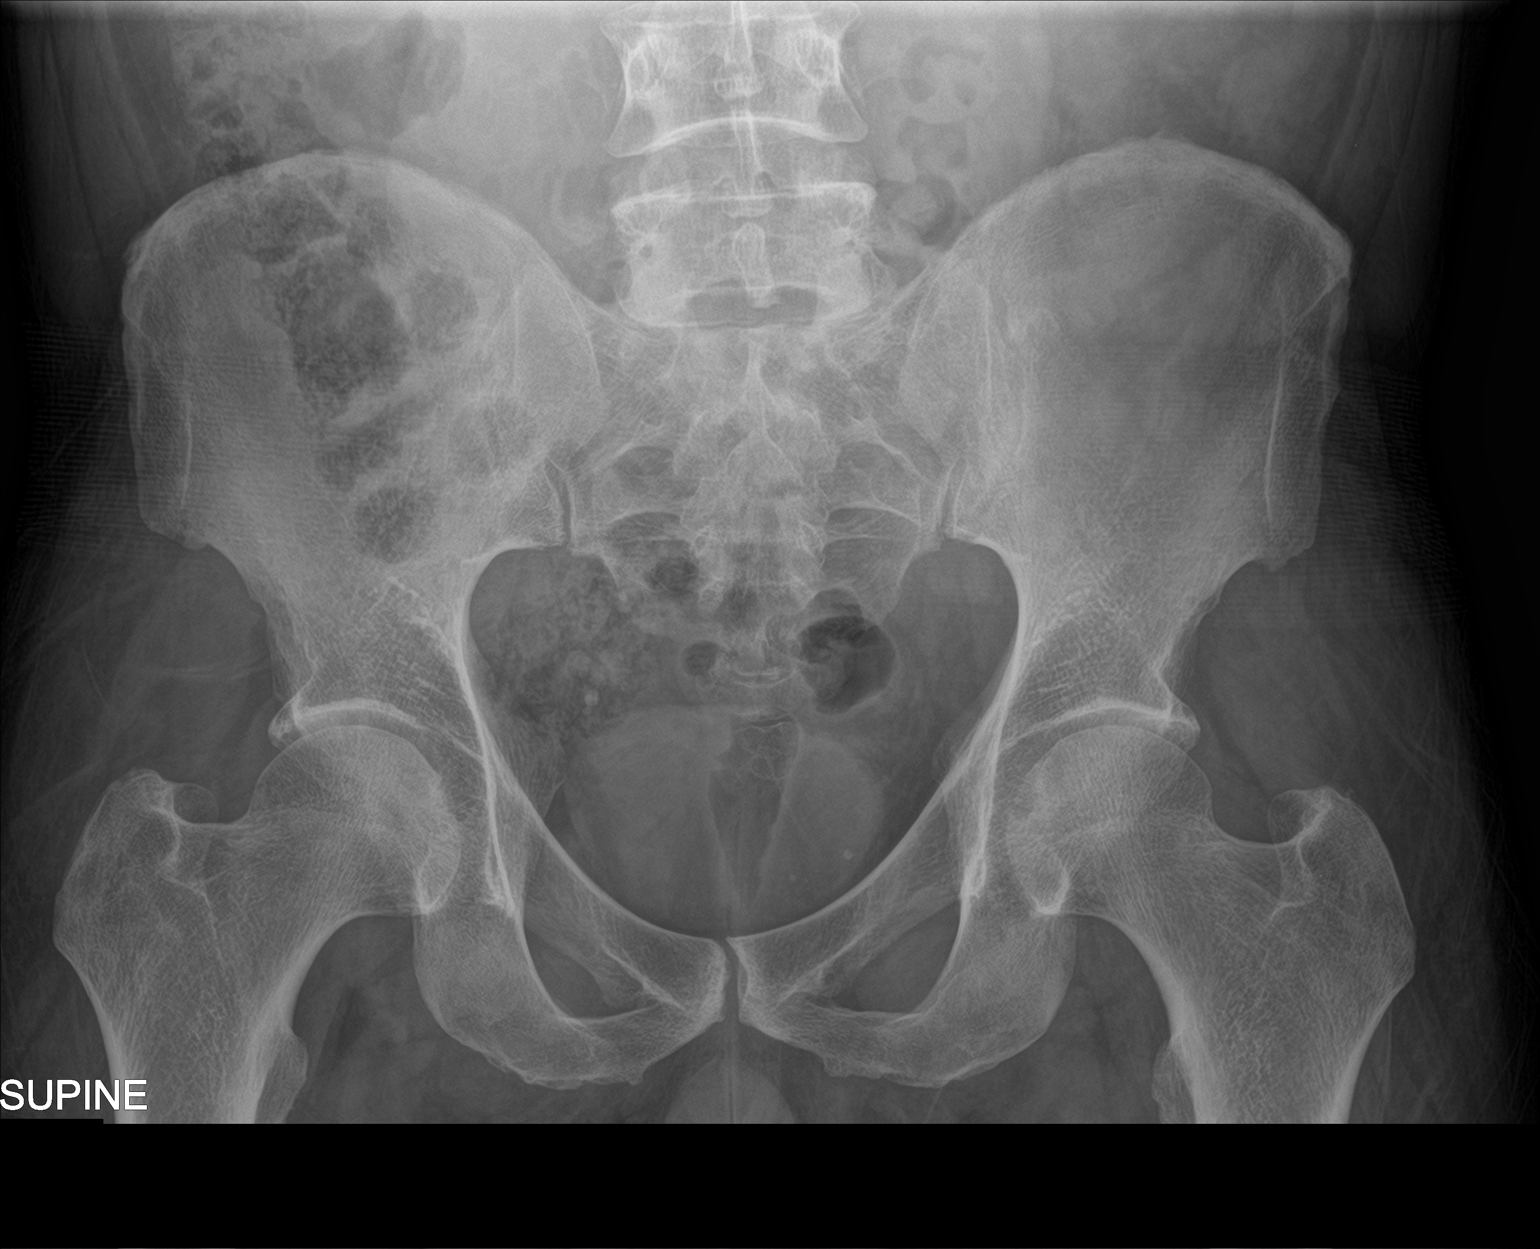

[abdomen kub (2 of 2)]
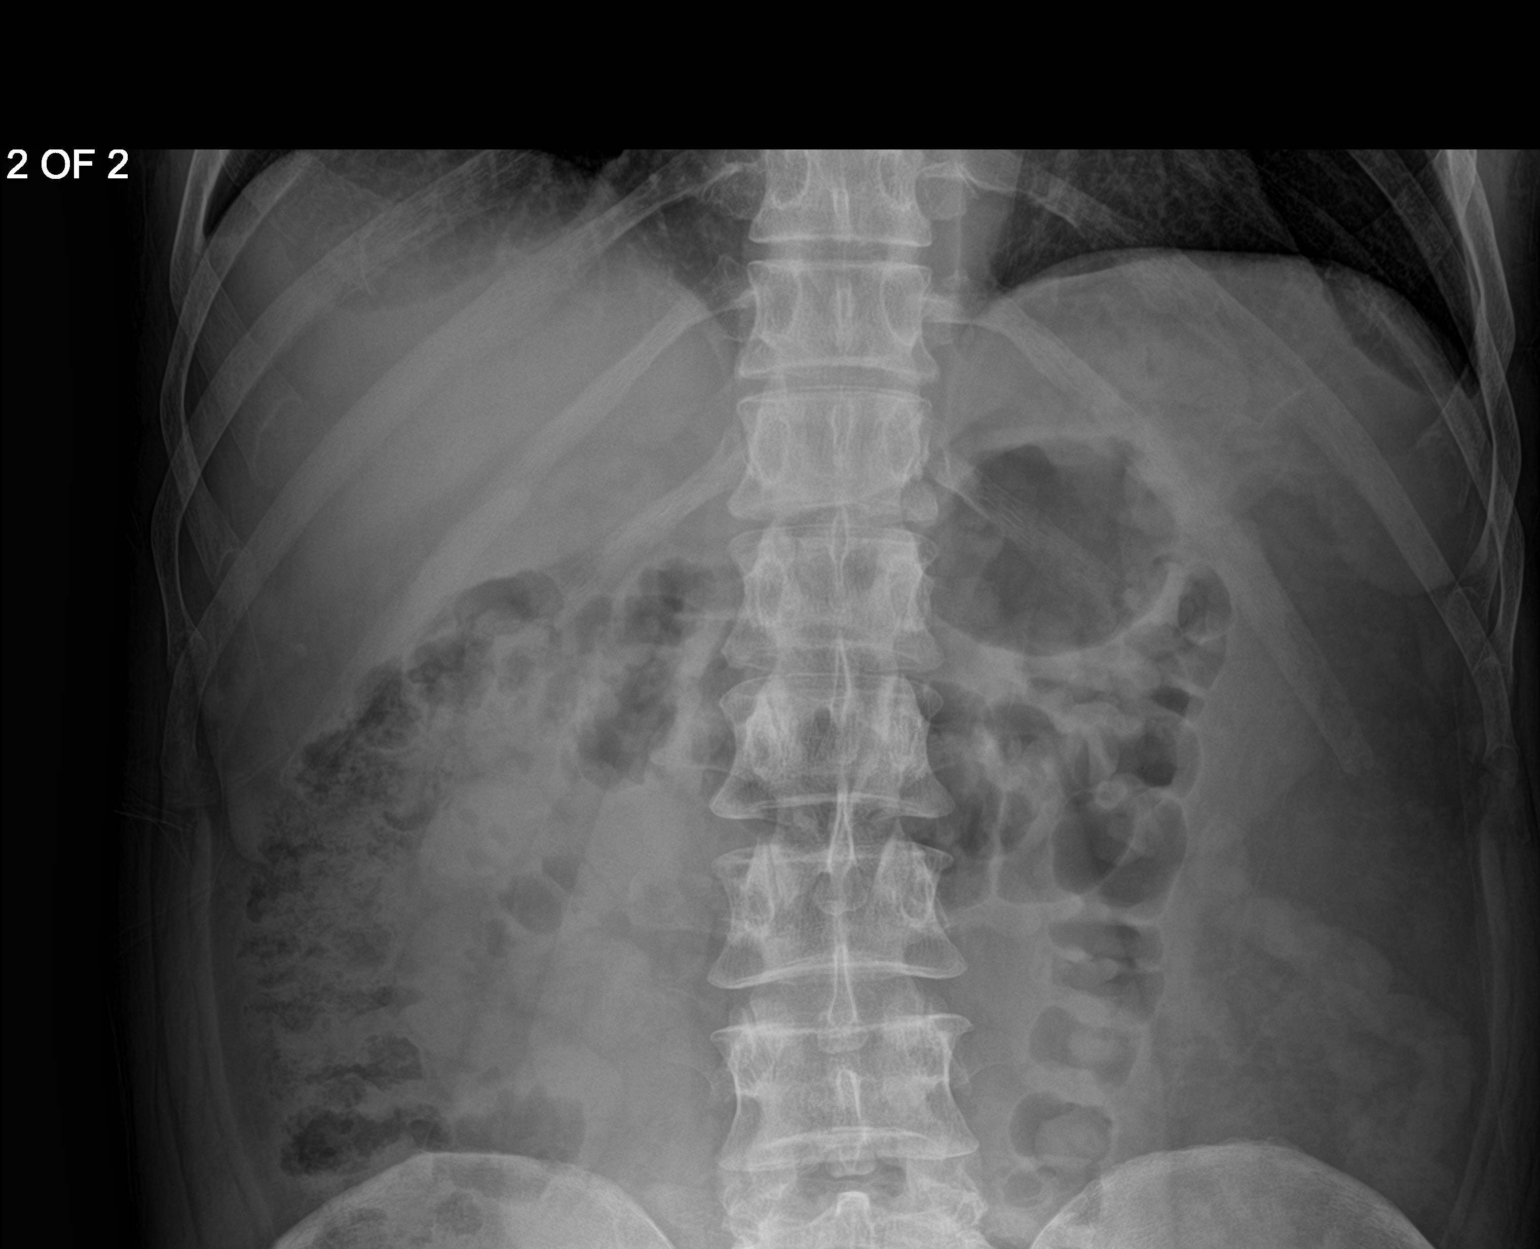

[2 of 2 positions shown; findings below may reference images not displayed]

FINDINGS: Scattered gas and stool throughout the colon. No small or large
bowel distention. No radiopaque stones. Visualized bones appear
intact.
IMPRESSION: Nonobstructive bowel gas pattern.

## 2018-05-07 ENCOUNTER — Encounter: Payer: Self-pay | Admitting: Family Medicine
